# Patient Record
Sex: Male | Born: 1980 | Race: White | Hispanic: No | Marital: Married | Smoking: Current every day smoker
Health system: Southern US, Community
[De-identification: ages and names within clinical notes are randomized; demographics above are authoritative.]

## PROBLEM LIST (undated history)

## (undated) DIAGNOSIS — E109 Type 1 diabetes mellitus without complications: Secondary | ICD-10-CM

---
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
## 2009-01-25 ENCOUNTER — Inpatient Hospital Stay (HOSPITAL_COMMUNITY): Admission: EM | Admit: 2009-01-25 | Discharge: 2009-01-26 | Payer: Self-pay | Admitting: Emergency Medicine

## 2010-04-10 LAB — GLUCOSE, CAPILLARY
Glucose-Capillary: 146 mg/dL — ABNORMAL HIGH (ref 70–99)
Glucose-Capillary: 148 mg/dL — ABNORMAL HIGH (ref 70–99)
Glucose-Capillary: 186 mg/dL — ABNORMAL HIGH (ref 70–99)
Glucose-Capillary: 350 mg/dL — ABNORMAL HIGH (ref 70–99)

## 2010-04-10 LAB — COMPREHENSIVE METABOLIC PANEL
ALT: 35 U/L (ref 0–53)
AST: 26 U/L (ref 0–37)
Albumin: 5 g/dL (ref 3.5–5.2)
Alkaline Phosphatase: 113 U/L (ref 39–117)
BUN: 17 mg/dL (ref 6–23)
CO2: 6 mEq/L — CL (ref 19–32)
Calcium: 9 mg/dL (ref 8.4–10.5)
Chloride: 100 mEq/L (ref 96–112)
Creatinine, Ser: 1.6 mg/dL — ABNORMAL HIGH (ref 0.4–1.5)
GFR calc Af Amer: >60 mL/min (ref >60)
GFR calc non Af Amer: 52 mL/min — ABNORMAL LOW (ref >60)
Glucose, Bld: 353 mg/dL — ABNORMAL HIGH (ref 70–99)
Potassium: 4.4 mEq/L (ref 3.5–5.1)
Sodium: 133 mEq/L — ABNORMAL LOW (ref 135–145)
Total Bilirubin: 2.1 mg/dL — ABNORMAL HIGH (ref 0.3–1.2)
Total Protein: 8.6 g/dL — ABNORMAL HIGH (ref 6.0–8.3)

## 2010-04-10 LAB — CBC
HCT: 45.2 % (ref 39.0–52.0)
Hemoglobin: 15.7 g/dL (ref 13.0–17.0)
MCHC: 34.8 g/dL (ref 30.0–36.0)
MCHC: 35.6 g/dL (ref 30.0–36.0)
MCV: 101.4 fL — ABNORMAL HIGH (ref 78.0–100.0)
MCV: 99.5 fL (ref 78.0–100.0)
Platelets: 281 10*3/uL (ref 150–400)
RBC: 4.02 MIL/uL — ABNORMAL LOW (ref 4.22–5.81)
RBC: 4.46 MIL/uL (ref 4.22–5.81)
RDW: 12.3 % (ref 11.5–15.5)
WBC: 29.7 10*3/uL — ABNORMAL HIGH (ref 4.0–10.5)

## 2010-04-10 LAB — BASIC METABOLIC PANEL
BUN: 12 mg/dL (ref 6–23)
CO2: 22 mEq/L (ref 19–32)
CO2: 6 mEq/L — CL (ref 19–32)
Calcium: 8.1 mg/dL — ABNORMAL LOW (ref 8.4–10.5)
Calcium: 8.6 mg/dL (ref 8.4–10.5)
Chloride: 106 mEq/L (ref 96–112)
Chloride: 106 mEq/L (ref 96–112)
Chloride: 107 mEq/L (ref 96–112)
Creatinine, Ser: 0.97 mg/dL (ref 0.4–1.5)
Creatinine, Ser: 1.14 mg/dL (ref 0.4–1.5)
GFR calc Af Amer: >60 mL/min (ref >60)
GFR calc Af Amer: >60 mL/min (ref >60)
GFR calc Af Amer: >60 mL/min (ref >60)
Glucose, Bld: 217 mg/dL — ABNORMAL HIGH (ref 70–99)
Potassium: 3.8 mEq/L (ref 3.5–5.1)
Sodium: 131 mEq/L — ABNORMAL LOW (ref 135–145)
Sodium: 135 mEq/L (ref 135–145)

## 2010-04-10 LAB — MAGNESIUM
Magnesium: 2 mg/dL (ref 1.5–2.5)
Magnesium: 2 mg/dL (ref 1.5–2.5)
Magnesium: 2 mg/dL (ref 1.5–2.5)
Magnesium: 2.2 mg/dL (ref 1.5–2.5)

## 2010-04-10 LAB — URINE MICROSCOPIC-ADD ON

## 2010-04-10 LAB — DIFFERENTIAL
Basophils Absolute: 0 10*3/uL (ref 0.0–0.1)
Basophils Relative: 0 % (ref 0–1)
Eosinophils Absolute: 0 10*3/uL (ref 0.0–0.7)
Eosinophils Relative: 0 % (ref 0–5)
Lymphocytes Relative: 7 % — ABNORMAL LOW (ref 12–46)
Lymphs Abs: 2.1 10*3/uL (ref 0.7–4.0)
Monocytes Absolute: 2.1 10*3/uL — ABNORMAL HIGH (ref 0.1–1.0)
Monocytes Relative: 7 % (ref 3–12)
Neutro Abs: 25.5 10*3/uL — ABNORMAL HIGH (ref 1.7–7.7)
Neutrophils Relative %: 86 % — ABNORMAL HIGH (ref 43–77)

## 2010-04-10 LAB — PHOSPHORUS
Phosphorus: 2 mg/dL — ABNORMAL LOW (ref 2.3–4.6)
Phosphorus: 2.5 mg/dL (ref 2.3–4.6)
Phosphorus: 2.7 mg/dL (ref 2.3–4.6)

## 2010-04-10 LAB — URINALYSIS, ROUTINE W REFLEX MICROSCOPIC
Glucose, UA: >1000 mg/dL — AB
Specific Gravity, Urine: 1.027 (ref 1.005–1.030)
Urobilinogen, UA: 0.2 mg/dL (ref 0.0–1.0)
pH: 5 (ref 5.0–8.0)

## 2010-08-10 ENCOUNTER — Other Ambulatory Visit (HOSPITAL_COMMUNITY): Payer: Self-pay | Admitting: Family Medicine

## 2010-08-10 ENCOUNTER — Encounter (HOSPITAL_COMMUNITY): Payer: Self-pay

## 2010-08-10 ENCOUNTER — Ambulatory Visit (HOSPITAL_COMMUNITY)
Admission: RE | Admit: 2010-08-10 | Discharge: 2010-08-10 | Disposition: A | Discharge status: Home / Self Care | Payer: Managed Care, Other (non HMO) | Source: Ambulatory Visit | Attending: Family Medicine | Admitting: Family Medicine

## 2010-08-10 DIAGNOSIS — T148XXA Other injury of unspecified body region, initial encounter: Secondary | ICD-10-CM

## 2010-08-10 DIAGNOSIS — X58XXXA Exposure to other specified factors, initial encounter: Secondary | ICD-10-CM | POA: Insufficient documentation

## 2010-08-10 DIAGNOSIS — S91309A Unspecified open wound, unspecified foot, initial encounter: Secondary | ICD-10-CM | POA: Insufficient documentation

## 2010-08-10 DIAGNOSIS — E119 Type 2 diabetes mellitus without complications: Secondary | ICD-10-CM | POA: Insufficient documentation

## 2010-08-10 DIAGNOSIS — M7989 Other specified soft tissue disorders: Secondary | ICD-10-CM | POA: Insufficient documentation

## 2010-10-28 ENCOUNTER — Other Ambulatory Visit (HOSPITAL_COMMUNITY): Payer: Self-pay | Admitting: Internal Medicine

## 2010-10-28 DIAGNOSIS — R599 Enlarged lymph nodes, unspecified: Secondary | ICD-10-CM

## 2010-11-01 ENCOUNTER — Ambulatory Visit (HOSPITAL_COMMUNITY): Admission: RE | Admit: 2010-11-01 | Payer: Managed Care, Other (non HMO) | Source: Ambulatory Visit

## 2015-08-25 ENCOUNTER — Ambulatory Visit: Payer: Self-pay | Admitting: "Endocrinology

## 2018-07-29 ENCOUNTER — Encounter (HOSPITAL_COMMUNITY): Payer: Self-pay | Admitting: Emergency Medicine

## 2018-07-29 ENCOUNTER — Emergency Department (HOSPITAL_COMMUNITY): Payer: Managed Care, Other (non HMO)

## 2018-07-29 ENCOUNTER — Other Ambulatory Visit: Payer: Self-pay

## 2018-07-29 ENCOUNTER — Emergency Department (HOSPITAL_COMMUNITY)
Admission: EM | Admit: 2018-07-29 | Discharge: 2018-07-30 | Disposition: A | Discharge status: Home / Self Care | Payer: Managed Care, Other (non HMO) | Attending: Emergency Medicine | Admitting: Emergency Medicine

## 2018-07-29 DIAGNOSIS — R0789 Other chest pain: Secondary | ICD-10-CM | POA: Diagnosis present

## 2018-07-29 DIAGNOSIS — Z20828 Contact with and (suspected) exposure to other viral communicable diseases: Secondary | ICD-10-CM | POA: Insufficient documentation

## 2018-07-29 DIAGNOSIS — J40 Bronchitis, not specified as acute or chronic: Secondary | ICD-10-CM | POA: Insufficient documentation

## 2018-07-29 DIAGNOSIS — Z20822 Contact with and (suspected) exposure to covid-19: Secondary | ICD-10-CM

## 2018-07-29 DIAGNOSIS — E109 Type 1 diabetes mellitus without complications: Secondary | ICD-10-CM | POA: Insufficient documentation

## 2018-07-29 LAB — BASIC METABOLIC PANEL
Anion gap: 13 (ref 5–15)
BUN: 10 mg/dL (ref 6–20)
CO2: 24 mmol/L (ref 22–32)
Calcium: 9.5 mg/dL (ref 8.9–10.3)
Chloride: 102 mmol/L (ref 98–111)
Creatinine, Ser: 0.94 mg/dL (ref 0.61–1.24)
GFR calc Af Amer: >60 mL/min (ref >60)
GFR calc non Af Amer: >60 mL/min (ref >60)
Glucose, Bld: 227 mg/dL — ABNORMAL HIGH (ref 70–99)
Potassium: 4.1 mmol/L (ref 3.5–5.1)
Sodium: 139 mmol/L (ref 135–145)

## 2018-07-29 LAB — CBC
HCT: 43.8 % (ref 39.0–52.0)
Hemoglobin: 15.2 g/dL (ref 13.0–17.0)
MCH: 36.3 pg — ABNORMAL HIGH (ref 26.0–34.0)
MCHC: 34.7 g/dL (ref 30.0–36.0)
MCV: 104.5 fL — ABNORMAL HIGH (ref 80.0–100.0)
Platelets: 224 10*3/uL (ref 150–400)
RBC: 4.19 MIL/uL — ABNORMAL LOW (ref 4.22–5.81)
RDW: 12.2 % (ref 11.5–15.5)
WBC: 8.2 10*3/uL (ref 4.0–10.5)
nRBC: 0 % (ref 0.0–0.2)

## 2018-07-29 LAB — TROPONIN I (HIGH SENSITIVITY)
Troponin I (High Sensitivity): 5 ng/L (ref <18)
Troponin I (High Sensitivity): 4 ng/L (ref <18)

## 2018-07-29 LAB — D-DIMER, QUANTITATIVE: D-Dimer, Quant: 0.6 ug/mL-FEU — ABNORMAL HIGH (ref 0.00–0.50)

## 2018-07-29 MED ORDER — IOHEXOL 350 MG/ML SOLN
75.0000 mL | Freq: Once | INTRAVENOUS | Status: AC | PRN
  Administered 2018-07-29: 75 mL via INTRAVENOUS

## 2018-07-29 MED ORDER — SODIUM CHLORIDE 0.9% FLUSH: 3.0000 mL | Freq: Once | INTRAVENOUS | Status: DC

## 2018-07-29 NOTE — ED Notes (Signed)
No pain iv inserted for c-t angio

## 2018-07-29 NOTE — ED Notes (Signed)
Pt c/ochest pain for 2 days until he arrived here  No chest pain now.    Productive cough  No temp   Alert no distress

## 2018-07-29 NOTE — Discharge Instructions (Signed)
Person Under Monitoring Name: Tommy Martinez  Location: [address] [place] [address]   Infection Prevention Recommendations for Individuals Confirmed to have, or Being Evaluated for, 2019 Novel Coronavirus (COVID-19) Infection Who Receive Care at Home  Individuals who are confirmed to have, or are being evaluated for, COVID-19 should follow the prevention steps below until a healthcare provider or local or state health department says they can return to normal activities.  Stay home except to get medical care You should restrict activities outside your home, except for getting medical care. Do not go to work, school, or public areas, and do not use public transportation or taxis.  Call ahead before visiting your doctor Before your medical appointment, call the healthcare provider and tell them that you have, or are being evaluated for, COVID-19 infection. This will help the healthcare providers office take steps to keep other people from getting infected. Ask your healthcare provider to call the local or state health department.  Monitor your symptoms Seek prompt medical attention if your illness is worsening (e.g., difficulty breathing). Before going to your medical appointment, call the healthcare provider and tell them that you have, or are being evaluated for, COVID-19 infection. Ask your healthcare provider to call the local or state health department.  Wear a facemask You should wear a facemask that covers your nose and mouth when you are in the same room with other people and when you visit a healthcare provider. People who live with or visit you should also wear a facemask while they are in the same room with you.  Separate yourself from other people in your home As much as possible, you should stay in a different room from other people in your home. Also, you should use a separate bathroom, if available.  Avoid sharing household items You should not share  dishes, drinking glasses, cups, eating utensils, towels, bedding, or other items with other people in your home. After using these items, you should wash them thoroughly with soap and water.  Cover your coughs and sneezes Cover your mouth and nose with a tissue when you cough or sneeze, or you can cough or sneeze into your sleeve. Throw used tissues in a lined trash can, and immediately wash your hands with soap and water for at least 20 seconds or use an alcohol-based hand rub.  Wash your [employer]hands Wash your hands often and thoroughly with soap and water for at least 20 seconds. You can use an alcohol-based hand sanitizer if soap and water are not available and if your hands are not visibly dirty. Avoid touching your eyes, nose, and mouth with unwashed hands.   Prevention Steps for Caregivers and Household Members of Individuals Confirmed to have, or Being Evaluated for, COVID-19 Infection Being Cared for in the Home  If you live with, or provide care at home for, a person confirmed to have, or being evaluated for, COVID-19 infection please follow these guidelines to prevent infection:  Follow healthcare providers instructions Make sure that you understand and can help the patient follow any healthcare provider instructions for all care.  Provide for the patients basic needs You should help the patient with basic needs in the home and provide support for getting groceries, prescriptions, and other personal needs.  Monitor the patients symptoms If they are getting sicker, call his or her medical provider and tell them that the patient has, or is being evaluated for, COVID-19 infection. This will help the healthcare providers office  take steps to keep other people from getting infected. °Ask the healthcare provider to call the local or state health department. ° °Limit the number of people who have contact with the patient °If possible, have only one caregiver for the patient. °Other  household members should stay in another home or place of residence. If this is not possible, they should stay °in another room, or be separated from the patient as much as possible. Use a separate bathroom, if available. °Restrict visitors who do not have an essential need to be in the home. ° °Keep older adults, very young children, and other sick people away from the patient °Keep older adults, very young children, and those who have compromised immune systems or chronic health conditions away from the patient. This includes people with chronic heart, lung, or kidney conditions, diabetes, and cancer. ° °Ensure good ventilation °Make sure that shared spaces in the home have good air flow, such as from an air conditioner or an opened window, °weather permitting. ° °Wash your hands often °Wash your hands often and thoroughly with soap and water for at least 20 seconds. You can use an alcohol based hand sanitizer if soap and water are not available and if your hands are not visibly dirty. °Avoid touching your eyes, nose, and mouth with unwashed hands. °Use disposable paper towels to dry your hands. If not available, use dedicated cloth towels and replace them when they become wet. ° °Wear a facemask and gloves °Wear a disposable facemask at all times in the room and gloves when you touch or have contact with the patient’s blood, body fluids, and/or secretions or excretions, such as sweat, saliva, sputum, nasal mucus, vomit, urine, or feces.  Ensure the mask fits over your nose and mouth tightly, and do not touch it during use. °Throw out disposable facemasks and gloves after using them. Do not reuse. °Wash your hands immediately after removing your facemask and gloves. °If your personal clothing becomes contaminated, carefully remove clothing and launder. Wash your hands after handling contaminated clothing. °Place all used disposable facemasks, gloves, and other waste in a lined container before disposing them with  other household waste. °Remove gloves and wash your hands immediately after handling these items. ° °Do not share dishes, glasses, or other household items with the patient °Avoid sharing household items. You should not share dishes, drinking glasses, cups, eating utensils, towels, bedding, or other items with a patient who is confirmed to have, or being evaluated for, COVID-19 infection. °After the person uses these items, you should wash them thoroughly with soap and water. ° °Wash laundry thoroughly °Immediately remove and wash clothes or bedding that have blood, body fluids, and/or secretions or excretions, such as sweat, saliva, sputum, nasal mucus, vomit, urine, or feces, on them. °Wear gloves when handling laundry from the patient. °Read and follow directions on labels of laundry or clothing items and detergent. In general, wash and dry with the warmest temperatures recommended on the label. ° °Clean all areas the individual has used often °Clean all touchable surfaces, such as counters, tabletops, doorknobs, bathroom fixtures, toilets, phones, keyboards, tablets, and bedside tables, every day. Also, clean any surfaces that may have blood, body fluids, and/or secretions or excretions on them. °Wear gloves when cleaning surfaces the patient has come in contact with. °Use a diluted bleach solution (e.g., dilute bleach with 1 part bleach and 10 parts water) or a household disinfectant with a label that says EPA-registered for coronaviruses. To make a bleach   solution at home, add 1 tablespoon of bleach to 1 quart (4 cups) of water. For a larger supply, add  cup of bleach to 1 gallon (16 cups) of water. Read labels of cleaning products and follow recommendations provided on product labels. Labels contain instructions for safe and effective use of the cleaning product including precautions you should take when applying the product, such as wearing gloves or eye protection and making sure you have good ventilation  during use of the product. Remove gloves and wash hands immediately after cleaning.  Monitor yourself for signs and symptoms of illness Caregivers and household members are considered close contacts, should monitor their health, and will be asked to limit movement outside of the home to the extent possible. Follow the monitoring steps for close contacts listed on the symptom monitoring form.   ? If you have additional questions, contact your local health department or call the epidemiologist on call at [phone] (available 24/7). ? This guidance is subject to change. For the most up-to-date guidance from [facility], please refer to their website: TripMetro.huhttps://www.cdc.gov/coronavirus/2019-ncov/hcp/guidance-prevent-spread.html      Person Under Monitoring Name: Tommy Martinez  Location: [address] [place] [id number]   CORONAVIRUS DISEASE 2019 (COVID-19) Guidance for Persons Under Investigation You are being tested for the virus that causes coronavirus disease 2019 (COVID-19). Public health actions are necessary to ensure protection of your health and the health of others, and to prevent further spread of infection. COVID-19 is caused by a virus that can cause symptoms, such as fever, cough, and shortness of breath. The primary transmission from person to person is by coughing or sneezing. On February 22, 2018, the World Health Organization announced a [employer] Emergency of International Concern and on February 23, 2018 the U.S. Department of Health and Human Services declared a public health emergency. If the virus that causesCOVID-19 spreads in the community, it could have severe public health consequences.  As a person under investigation for COVID-19, the [place] Department of Health and [employer], Division of [employer] advises you to adhere to the following guidance until your test results are reported to you. If your test result is positive, you will receive  additional information from your provider and your local health department at that time.  Remain at home until you are cleared by your health provider or public health authorities.  Keep a log of visitors to your home using the form provided. Any visitors to your home must be aware of your isolation status. If you plan to move to a new address or leave the county, notify the local health department in your county. Call a doctor or seek care if you have an urgent medical need. Before seeking medical care, call ahead and get instructions from the provider before arriving at the medical office, clinic or hospital. Notify them that you are being tested for the virus that causes COVID-19 so arrangements can be made, as necessary, to prevent transmission to others in the healthcare setting. Next, notify the local health department in your county. If a medical emergency arises and you need to call 911, inform the first responders that you are being tested for the virus that causes COVID-19. Next, notify the local health department in your county. Adhere to all guidance set forth by the [facility]  Division of [employer] for [facility] of patients that is based on guidance from the Center for Disease Control and Prevention with suspected or confirmed COVID-19. It is provided  with this guidance for Persons Under Investigation.  Your health and the health of our community are our top priorities. Public Health officials remain available to provide assistance and counseling to you about COVID-19 and compliance with this guidance.  Provider: ____________________________________________________________ Date: ______/_____/_________  By signing below, you acknowledge that you have read and agree to comply with this Guidance for Persons Under Investigation. ______________________________________________________________ Date: ______/_____/_________  WHO DO I CALL? You can find a list of local health departments  here: [email or web address] Health Department: ____________________________________________________________________ Contact Name: ________________________________________________________________________ Telephone: ___________________________________________________________________________  Marice Potter, [place], Communicable Disease Branch COVID-19 Guidance for Persons Under Investigation March 31, 2018

## 2018-07-29 NOTE — ED Triage Notes (Signed)
Pt here for new onset centralized chest pain radiating to the shoulder blades. Pt also reports some dizziness. Denies SOB.

## 2018-07-30 NOTE — ED Provider Notes (Signed)
I assumed care of this patient from Dr. Billy Fischer at 2300.  Please see their note for further details of Hx, PE.  Briefly patient is a 38 y.o. male who presented with pleuritic chest pain. EKG and other work up reassuring. However dimer mildly elevated. Pending CTA.  CTA negative for PE or Pneumonia. Noted to have peribronchial thickening. Will get OP COVID testing.  Otherwise he is well-appearing, nontoxic and not hypoxic.  Appropriate for discharge with strict return precautions.  Isolation recommended.  Tommy Martinez was evaluated in Emergency Department on 07/30/2018 for the symptoms described in the history of present illness. He was evaluated in the context of the global COVID-19 pandemic, which necessitated consideration that the patient might be at risk for infection with the SARS-CoV-2 virus that causes COVID-19. Institutional protocols and algorithms that pertain to the evaluation of patients at risk for COVID-19 are in a state of rapid change based on information released by regulatory bodies including the CDC and federal and state organizations. These policies and algorithms were followed during the patient's care in the ED.  Patient evaluated, tested and sent home with instructions for home care and Quarantine. Instructed to seek further care if symptoms worsen.  Is set up with follow up for a virtual visit with PCP in next 24-48 hours.   The patient appears reasonably screened and/or stabilized for discharge and I doubt any other medical condition or other [facility] requiring further screening, evaluation, or treatment in the ED at this time prior to discharge.  Disposition: Discharge  Condition: Good  I have discussed the results, Dx and Tx plan with the patient who expressed understanding and agree(s) with the plan. Discharge instructions discussed at great length. The patient was given strict return precautions who verbalized understanding of the instructions. No further questions at time of  discharge.    ED Discharge Orders    None        Follow Up: Sharilyn Sites, [place] [address] [phone]  Schedule an appointment as soon as possible for a visit  If symptoms do not improve or  worsen, in 3-5 days      Cardama, Grayce Sessions, MD 07/30/18 0045

## 2018-07-30 NOTE — ED Provider Notes (Signed)
[facility] EMERGENCY DEPARTMENT Provider Note   CSN: [id number] Arrival date & time: 07/29/18  1748     History   Chief Complaint Chief Complaint  Patient presents with   Chest Pain    HPI Tommy Martinez is a 38 y.o. male.     HPI   38yo male with history of Type 1 DM presents with concern for chest pain. Reports pain started yesterday. Sharp pain worse with deep breaths and coughing. Mild nonproductive cough. Not exertional pain, not positional, not worse with palpation. Associated dyspnea.  No n/v/fever.  No recent surgeries, long trips or leg swelling or pain. No hx of DVT or PE.  No fam hx of early CAD. Smokes cigarettes and drinks etoh regularly, denies other drug use.   Past Medical History:  Diagnosis Date   Diabetes mellitus     There are no active problems to display for this patient.      Home Medications    Prior to Admission medications   Not on File    Family History No family history on file.  Social History Social History   Tobacco Use   Smoking status: Not on file  Substance Use Topics   Alcohol use: Not on file   Drug use: Not on file     Allergies   Patient has no known allergies.   Review of Systems Review of Systems  Constitutional: Negative for fever.  HENT: Negative for sore throat.   Eyes: Negative for visual disturbance.  Respiratory: Positive for cough and shortness of breath.   Cardiovascular: Positive for chest pain.  Gastrointestinal: Negative for abdominal pain, nausea and vomiting.  Genitourinary: Negative for difficulty urinating.  Musculoskeletal: Negative for back pain and neck stiffness.  Skin: Negative for rash.  Neurological: Negative for syncope and headaches.     Physical Exam Updated Vital Signs BP (!) 144/94    Pulse (!) 106    Temp 98.2 F (36.8 C) (Oral)    Resp 15    SpO2 98%   Physical Exam Vitals signs and nursing note reviewed.  Constitutional:      General: He is not in  acute distress.    Appearance: He is well-developed. He is not diaphoretic.  HENT:     Head: Normocephalic and atraumatic.  Eyes:     Conjunctiva/sclera: Conjunctivae normal.  Neck:     Musculoskeletal: Normal range of motion.  Cardiovascular:     Rate and Rhythm: Regular rhythm. Tachycardia present.     Heart sounds: Normal heart sounds. No murmur. No friction rub. No gallop.   Pulmonary:     Effort: Pulmonary effort is normal. No respiratory distress.     Breath sounds: No rales.  Abdominal:     General: There is no distension.     Palpations: Abdomen is soft.     Tenderness: There is no abdominal tenderness. There is no guarding.  Skin:    General: Skin is warm and dry.  Neurological:     Mental Status: He is alert and oriented to person, place, and time.      ED Treatments / Results  Labs (all labs ordered are listed, but only abnormal results are displayed) Labs Reviewed  BASIC METABOLIC PANEL - Abnormal; Notable for the following components:      Result Value   Glucose, Bld 227 (*)    All other components within normal limits  CBC - Abnormal; Notable for the following components:   RBC 4.19 (*)  MCV 104.5 (*)    MCH 36.3 (*)    All other components within normal limits  D-DIMER, QUANTITATIVE (NOT AT [facility]) - Abnormal; Notable for the following components:   D-Dimer, Quant 0.60 (*)    All other components within normal limits  NOVEL CORONAVIRUS, NAA (HOSPITAL ORDER, SEND-OUT TO REF LAB)  TROPONIN I (HIGH SENSITIVITY)  TROPONIN I (HIGH SENSITIVITY)    EKG EKG Interpretation  Date/Time:  Sunday July 29 2018 18:03:56 EDT Ventricular Rate:  113 PR Interval:  150 QRS Duration: 90 QT Interval:  322 QTC Calculation: 441 R Axis:   108 Text Interpretation:  Sinus tachycardia Right atrial enlargement Rightward axis Pulmonary disease pattern Abnormal ECG No previous ECGs available Confirmed by Spiro Ausborn ([id number]) on 07/29/2018 6:07:36 PM   Radiology Dg Chest  2 View  Result Date: 07/29/2018 CLINICAL DATA:  38 year old male with new onset chest pain radiating to the shoulder blades with dizziness. EXAM: CHEST - 2 VIEW COMPARISON:  None. FINDINGS: Lung volumes and mediastinal contours are normal. Visualized tracheal air column is within normal limits. No pneumothorax, pulmonary edema, pleural effusion or confluent pulmonary opacity. Mild artifact from EKG leads. Questionable borderline to mild increased interstitial markings. No osseous abnormality identified. Paucity of bowel gas in the upper abdomen. IMPRESSION: Negative.  No acute cardiopulmonary abnormality. Electronically Signed   By: H  Hall M.D.   On: 07/29/2018 18:43   Ct Angio Chest Pe W And/or Wo Contrast  Result Date: 07/29/2018 CLINICAL DATA:  Chest pain, PE suspected, low/intermediate prob, positive D-dimer EXAM: CT ANGIOGRAPHY CHEST WITH CONTRAST TECHNIQUE: Multidetector CT imaging of the chest was performed using the standard protocol during bolus administration of intravenous contrast. Multiplanar CT image reconstructions and MIPs were obtained to evaluate the vascular anatomy. CONTRAST:  75mL OMNIPAQUE IOHEXOL 350 MG/ML SOLN COMPARISON:  Radiographs earlier this day. FINDINGS: Cardiovascular: There are no filling defects within the pulmonary arteries to suggest pulmonary embolus. Thoracic aorta is normal in caliber without dissection. Conventional branching pattern from the aortic arch. Heart is normal in size. No pericardial fluid. Mediastinum/Nodes: No enlarged mediastinal or hilar lymph nodes. Esophagus is decompressed. Visualized thyroid nodule. Lungs/Pleura: Mucus/debris in the left lower lobe bronchus. Central bronchial thickening is most prominent in both lower lobes. No consolidation or focal airspace disease. No pulmonary edema. No pleural fluid. Small biapical blebs/emphysema. Upper Abdomen: No acute abnormality. Musculoskeletal: There are no acute or suspicious osseous abnormalities. Review  of the MIP images confirms the above findings. IMPRESSION: 1. No pulmonary embolus. 2. Central bronchial thickening, bronchitis, asthma, or smoking related lung disease if patient is a smoker. Mucus/debris in the left lower lobe bronchus. 3. Small biapical blebs versus mild emphysema, recommend correlation with smoking history. Electronically Signed   By: Melanie  Sanford M.D.   On: 07/29/2018 23:28    Procedures Procedures (including critical care time)  Medications Ordered in ED Medications  iohexol (OMNIPAQUE) 350 MG/ML injection 75 mL (75 mLs Intravenous Contrast Given 07/29/18 2236)     Initial Impression / Assessment and Plan / ED Course  I have reviewed the triage vital signs and the nursing notes.  Pertinent labs & imaging results that were available during my care of the patient were reviewed by me and considered in my medical decision making (see chart for details).        38 yo male with history of Type 1 DM presents with concern for chest pain.  Differential diagnosis for chest pain includes pulmonary embolus, dissection, pneumothorax, pneumonia, ACS, myocarditis,  pericarditis.  EKG was done and evaluate by me and showed no acute ST changes and no signs of pericarditis. Chest x-ray was done and evaluated by me and radiology and showed no sign of pneumonia or pneumothorax. Patient is low risk Wells and ddimer positive with CTA pending.   Patient is low risk HEART score and had delta troponins which were 5 and 4 and doubt ACS.   CT PE study pending at time of transfer of care. If no PE, plan to send home with COVID test and precautions.   Final Clinical Impressions(s) / ED Diagnoses   Final diagnoses:  Bronchitis  Suspected Covid-19 Virus Infection    ED Discharge Orders    None       Alvira MondaySchlossman, Madgie Dhaliwal, MD 07/30/18 1446

## 2018-07-31 LAB — NOVEL CORONAVIRUS, NAA (HOSP ORDER, SEND-OUT TO REF LAB; TAT 18-24 HRS): SARS-CoV-2, NAA: NOT DETECTED

## 2019-03-09 ENCOUNTER — Emergency Department (HOSPITAL_COMMUNITY): Payer: Managed Care, Other (non HMO)

## 2019-03-09 ENCOUNTER — Other Ambulatory Visit: Payer: Self-pay

## 2019-03-09 ENCOUNTER — Encounter (HOSPITAL_COMMUNITY): Payer: Self-pay | Admitting: *Deleted

## 2019-03-09 ENCOUNTER — Inpatient Hospital Stay (HOSPITAL_COMMUNITY)
Admission: EM | Admit: 2019-03-09 | Discharge: 2019-03-11 | DRG: 638 | Disposition: A | Discharge status: Home / Self Care | Payer: Managed Care, Other (non HMO) | Attending: Internal Medicine | Admitting: Internal Medicine

## 2019-03-09 DIAGNOSIS — Z20822 Contact with and (suspected) exposure to covid-19: Secondary | ICD-10-CM | POA: Diagnosis present

## 2019-03-09 DIAGNOSIS — E1010 Type 1 diabetes mellitus with ketoacidosis without coma: Principal | ICD-10-CM | POA: Diagnosis present

## 2019-03-09 DIAGNOSIS — Z9119 Patient's noncompliance with other medical treatment and regimen: Secondary | ICD-10-CM

## 2019-03-09 DIAGNOSIS — F17200 Nicotine dependence, unspecified, uncomplicated: Secondary | ICD-10-CM | POA: Diagnosis present

## 2019-03-09 DIAGNOSIS — F1020 Alcohol dependence, uncomplicated: Secondary | ICD-10-CM | POA: Diagnosis not present

## 2019-03-09 DIAGNOSIS — J029 Acute pharyngitis, unspecified: Secondary | ICD-10-CM | POA: Diagnosis present

## 2019-03-09 DIAGNOSIS — F1010 Alcohol abuse, uncomplicated: Secondary | ICD-10-CM | POA: Diagnosis present

## 2019-03-09 DIAGNOSIS — E861 Hypovolemia: Secondary | ICD-10-CM | POA: Diagnosis present

## 2019-03-09 DIAGNOSIS — Z794 Long term (current) use of insulin: Secondary | ICD-10-CM

## 2019-03-09 DIAGNOSIS — R7401 Elevation of levels of liver transaminase levels: Secondary | ICD-10-CM

## 2019-03-09 DIAGNOSIS — R000 Tachycardia, unspecified: Secondary | ICD-10-CM | POA: Diagnosis present

## 2019-03-09 DIAGNOSIS — N179 Acute kidney failure, unspecified: Secondary | ICD-10-CM

## 2019-03-09 DIAGNOSIS — E1110 Type 2 diabetes mellitus with ketoacidosis without coma: Secondary | ICD-10-CM | POA: Diagnosis present

## 2019-03-09 DIAGNOSIS — R1110 Vomiting, unspecified: Secondary | ICD-10-CM | POA: Diagnosis not present

## 2019-03-09 HISTORY — DX: Type 1 diabetes mellitus without complications: E10.9

## 2019-03-09 LAB — URINALYSIS, ROUTINE W REFLEX MICROSCOPIC
Bacteria, UA: NONE SEEN
Bilirubin Urine: NEGATIVE
Glucose, UA: >=500 mg/dL — AB
Ketones, ur: 80 mg/dL — AB
Leukocytes,Ua: NEGATIVE
Nitrite: NEGATIVE
Protein, ur: 100 mg/dL — AB
Specific Gravity, Urine: 1.014 (ref 1.005–1.030)
pH: 5 (ref 5.0–8.0)

## 2019-03-09 LAB — COMPREHENSIVE METABOLIC PANEL
ALT: 68 U/L — ABNORMAL HIGH (ref 0–44)
AST: 179 U/L — ABNORMAL HIGH (ref 15–41)
Albumin: 4.5 g/dL (ref 3.5–5.0)
Alkaline Phosphatase: 153 U/L — ABNORMAL HIGH (ref 38–126)
Anion gap: 30 — ABNORMAL HIGH (ref 5–15)
BUN: 17 mg/dL (ref 6–20)
CO2: 16 mmol/L — ABNORMAL LOW (ref 22–32)
Calcium: 8.8 mg/dL — ABNORMAL LOW (ref 8.9–10.3)
Chloride: 89 mmol/L — ABNORMAL LOW (ref 98–111)
Creatinine, Ser: 1.55 mg/dL — ABNORMAL HIGH (ref 0.61–1.24)
GFR calc Af Amer: >60 mL/min (ref >60)
GFR calc non Af Amer: 56 mL/min — ABNORMAL LOW (ref >60)
Glucose, Bld: 219 mg/dL — ABNORMAL HIGH (ref 70–99)
Potassium: 4 mmol/L (ref 3.5–5.1)
Sodium: 135 mmol/L (ref 135–145)
Total Bilirubin: 2 mg/dL — ABNORMAL HIGH (ref 0.3–1.2)
Total Protein: 8 g/dL (ref 6.5–8.1)

## 2019-03-09 LAB — BASIC METABOLIC PANEL
Anion gap: 13 (ref 5–15)
BUN: 14 mg/dL (ref 6–20)
CO2: 27 mmol/L (ref 22–32)
Calcium: 8.4 mg/dL — ABNORMAL LOW (ref 8.9–10.3)
Chloride: 98 mmol/L (ref 98–111)
Creatinine, Ser: 0.99 mg/dL (ref 0.61–1.24)
GFR calc Af Amer: >60 mL/min (ref >60)
GFR calc non Af Amer: >60 mL/min (ref >60)
Glucose, Bld: 85 mg/dL (ref 70–99)
Potassium: 4.1 mmol/L (ref 3.5–5.1)
Sodium: 138 mmol/L (ref 135–145)

## 2019-03-09 LAB — CBG MONITORING, ED
Glucose-Capillary: 252 mg/dL — ABNORMAL HIGH (ref 70–99)
Glucose-Capillary: 126 mg/dL — ABNORMAL HIGH (ref 70–99)
Glucose-Capillary: 101 mg/dL — ABNORMAL HIGH (ref 70–99)
Glucose-Capillary: 87 mg/dL (ref 70–99)

## 2019-03-09 LAB — CBC WITH DIFFERENTIAL/PLATELET
Abs Immature Granulocytes: 0.09 10*3/uL — ABNORMAL HIGH (ref 0.00–0.07)
Basophils Absolute: 0 10*3/uL (ref 0.0–0.1)
Basophils Relative: 0 %
Eosinophils Absolute: 0 10*3/uL (ref 0.0–0.5)
Eosinophils Relative: 0 %
HCT: 41.8 % (ref 39.0–52.0)
Hemoglobin: 14.4 g/dL (ref 13.0–17.0)
Immature Granulocytes: 1 %
Lymphocytes Relative: 4 %
Lymphs Abs: 0.4 10*3/uL — ABNORMAL LOW (ref 0.7–4.0)
MCH: 36.8 pg — ABNORMAL HIGH (ref 26.0–34.0)
MCHC: 34.4 g/dL (ref 30.0–36.0)
MCV: 106.9 fL — ABNORMAL HIGH (ref 80.0–100.0)
Monocytes Absolute: 0.5 10*3/uL (ref 0.1–1.0)
Monocytes Relative: 4 %
Neutro Abs: 11.1 10*3/uL — ABNORMAL HIGH (ref 1.7–7.7)
Neutrophils Relative %: 91 %
Platelets: 218 10*3/uL (ref 150–400)
RBC: 3.91 MIL/uL — ABNORMAL LOW (ref 4.22–5.81)
RDW: 12 % (ref 11.5–15.5)
WBC: 12.2 10*3/uL — ABNORMAL HIGH (ref 4.0–10.5)
nRBC: 0 % (ref 0.0–0.2)

## 2019-03-09 LAB — GLUCOSE, CAPILLARY
Glucose-Capillary: 77 mg/dL (ref 70–99)
Glucose-Capillary: 76 mg/dL (ref 70–99)

## 2019-03-09 LAB — RESPIRATORY PANEL BY RT PCR (FLU A&B, COVID)
Influenza A by PCR: NEGATIVE
Influenza B by PCR: NEGATIVE
SARS Coronavirus 2 by RT PCR: NEGATIVE

## 2019-03-09 LAB — ETHANOL: Alcohol, Ethyl (B): <10 mg/dL (ref <10)

## 2019-03-09 LAB — BLOOD GAS, VENOUS
Acid-base deficit: 5.2 mmol/L — ABNORMAL HIGH (ref 0.0–2.0)
Bicarbonate: 18.6 mmol/L — ABNORMAL LOW (ref 20.0–28.0)
FIO2: 21
O2 Saturation: 27.3 %
Patient temperature: 36.6
pCO2, Ven: 40.1 mmHg — ABNORMAL LOW (ref 44.0–60.0)
pH, Ven: 7.316 (ref 7.250–7.430)
pO2, Ven: <31 mmHg — CL (ref 32.0–45.0)

## 2019-03-09 LAB — LIPASE, BLOOD: Lipase: 31 U/L (ref 11–51)

## 2019-03-09 MED ORDER — ONDANSETRON HCL 4 MG/2ML IJ SOLN
4.0000 mg | Freq: Once | INTRAMUSCULAR | Status: AC
  Administered 2019-03-09: 17:00:00 4 mg via INTRAVENOUS
  Filled 2019-03-09: qty 2

## 2019-03-09 MED ORDER — INSULIN REGULAR(HUMAN) IN NACL 100-0.9 UT/100ML-% IV SOLN: INTRAVENOUS | Status: DC

## 2019-03-09 MED ORDER — INSULIN ASPART 100 UNIT/ML ~~LOC~~ SOLN: 0.0000 [IU] | Freq: Three times a day (TID) | SUBCUTANEOUS | Status: DC

## 2019-03-09 MED ORDER — PHENOL 1.4 % MT LIQD
1.0000 | OROMUCOSAL | Status: DC | PRN
  Filled 2019-03-09 (×2): qty 177

## 2019-03-09 MED ORDER — LORAZEPAM 2 MG/ML IJ SOLN: 1.0000 mg | INTRAMUSCULAR | Status: DC | PRN

## 2019-03-09 MED ORDER — DEXTROSE-NACL 5-0.45 % IV SOLN: INTRAVENOUS | Status: DC

## 2019-03-09 MED ORDER — FOLIC ACID 1 MG PO TABS
1.0000 mg | ORAL_TABLET | Freq: Every day | ORAL | Status: DC
  Administered 2019-03-10 – 2019-03-11 (×2): 1 mg via ORAL
  Filled 2019-03-09 (×2): qty 1

## 2019-03-09 MED ORDER — FAMOTIDINE IN NACL 20-0.9 MG/50ML-% IV SOLN
20.0000 mg | INTRAVENOUS | Status: DC
  Administered 2019-03-09 – 2019-03-10 (×2): 20 mg via INTRAVENOUS
  Filled 2019-03-09 (×2): qty 50

## 2019-03-09 MED ORDER — POTASSIUM CHLORIDE 10 MEQ/100ML IV SOLN
10.0000 meq | INTRAVENOUS | Status: AC
  Administered 2019-03-09 (×2): 10 meq via INTRAVENOUS
  Filled 2019-03-09 (×2): qty 100

## 2019-03-09 MED ORDER — INSULIN REGULAR(HUMAN) IN NACL 100-0.9 UT/100ML-% IV SOLN
INTRAVENOUS | Status: DC
  Administered 2019-03-09: 20:00:00 0.6 [IU]/h via INTRAVENOUS
  Filled 2019-03-09: qty 100

## 2019-03-09 MED ORDER — ONDANSETRON HCL 4 MG/2ML IJ SOLN
4.0000 mg | Freq: Four times a day (QID) | INTRAMUSCULAR | Status: DC | PRN
  Administered 2019-03-09: 21:00:00 4 mg via INTRAVENOUS
  Filled 2019-03-09: qty 2

## 2019-03-09 MED ORDER — PHENOL 1.4 % MT LIQD
1.0000 | OROMUCOSAL | Status: DC | PRN
  Administered 2019-03-09: 23:00:00 1 via OROMUCOSAL

## 2019-03-09 MED ORDER — HEPARIN SODIUM (PORCINE) 5000 UNIT/ML IJ SOLN
5000.0000 [IU] | Freq: Three times a day (TID) | INTRAMUSCULAR | Status: DC
  Administered 2019-03-09 – 2019-03-11 (×5): 5000 [IU] via SUBCUTANEOUS
  Filled 2019-03-09 (×5): qty 1

## 2019-03-09 MED ORDER — POTASSIUM CHLORIDE 10 MEQ/100ML IV SOLN
10.0000 meq | INTRAVENOUS | Status: AC
  Administered 2019-03-09 – 2019-03-10 (×2): 10 meq via INTRAVENOUS
  Filled 2019-03-09 (×2): qty 100

## 2019-03-09 MED ORDER — LORAZEPAM 1 MG PO TABS: 1.0000 mg | ORAL_TABLET | ORAL | Status: DC | PRN

## 2019-03-09 MED ORDER — NICOTINE 7 MG/24HR TD PT24
7.0000 mg | MEDICATED_PATCH | Freq: Every day | TRANSDERMAL | Status: DC
  Administered 2019-03-09 – 2019-03-10 (×2): 7 mg via TRANSDERMAL
  Filled 2019-03-09 (×6): qty 1

## 2019-03-09 MED ORDER — SODIUM CHLORIDE 0.9 % IV SOLN
INTRAVENOUS | Status: DC
  Administered 2019-03-09: 20:00:00 100 mL/h via INTRAVENOUS

## 2019-03-09 MED ORDER — LORAZEPAM 2 MG/ML IJ SOLN: 0.0000 mg | Freq: Three times a day (TID) | INTRAMUSCULAR | Status: DC

## 2019-03-09 MED ORDER — ADULT MULTIVITAMIN W/MINERALS CH
1.0000 | ORAL_TABLET | Freq: Every day | ORAL | Status: DC
  Administered 2019-03-10 – 2019-03-11 (×2): 1 via ORAL
  Filled 2019-03-09 (×2): qty 1

## 2019-03-09 MED ORDER — DEXTROSE 50 % IV SOLN: 0.0000 mL | INTRAVENOUS | Status: DC | PRN

## 2019-03-09 MED ORDER — INSULIN GLARGINE 100 UNIT/ML ~~LOC~~ SOLN
10.0000 [IU] | Freq: Every day | SUBCUTANEOUS | Status: DC
  Administered 2019-03-09: 10 [IU] via SUBCUTANEOUS
  Filled 2019-03-09 (×2): qty 0.1

## 2019-03-09 MED ORDER — LORAZEPAM 2 MG/ML IJ SOLN
0.0000 mg | INTRAMUSCULAR | Status: DC
  Administered 2019-03-09: 1 mg via INTRAVENOUS
  Filled 2019-03-09 (×2): qty 1

## 2019-03-09 MED ORDER — PROCHLORPERAZINE EDISYLATE 10 MG/2ML IJ SOLN
10.0000 mg | Freq: Four times a day (QID) | INTRAMUSCULAR | Status: DC | PRN
  Administered 2019-03-09: 10 mg via INTRAVENOUS
  Filled 2019-03-09: qty 2

## 2019-03-09 MED ORDER — SODIUM CHLORIDE 0.9 % IV SOLN: INTRAVENOUS | Status: DC

## 2019-03-09 MED ORDER — THIAMINE HCL 100 MG/ML IJ SOLN: 100.0000 mg | Freq: Every day | INTRAMUSCULAR | Status: DC

## 2019-03-09 MED ORDER — DEXTROSE-NACL 5-0.45 % IV SOLN
INTRAVENOUS | Status: DC
  Administered 2019-03-09 (×2): 75 mL/h via INTRAVENOUS

## 2019-03-09 MED ORDER — SODIUM CHLORIDE 0.9 % IV BOLUS
2000.0000 mL | Freq: Once | INTRAVENOUS | Status: AC
  Administered 2019-03-09: 17:00:00 2000 mL via INTRAVENOUS

## 2019-03-09 MED ORDER — THIAMINE HCL 100 MG PO TABS
100.0000 mg | ORAL_TABLET | Freq: Every day | ORAL | Status: DC
  Administered 2019-03-10 – 2019-03-11 (×2): 100 mg via ORAL
  Filled 2019-03-09 (×2): qty 1

## 2019-03-09 NOTE — H&P (Signed)
TRH H&P    Patient Demographics:    Tommy Martinez, is a 39 y.o. male  MRN: [id number]  DOB - 1980-10-18  Admit Date - 03/09/2019  Referring MD/NP/PA: Merleen Nicely  Outpatient Primary MD for the patient is Sharilyn Sites, MD  Patient coming from: Home  Chief complaint- Vomiting   HPI:    Tommy Martinez  is a 39 y.o. male, with a history of type one diabetes presents with nausea, vomiting, diarrhea, and abdominal pain that started at 3:00 am 03/09/19. Patient reports that he knew he was in DKA because he has had DKA before. He does not check his glucose at home, and reports that he "wings it." In an effort to get himself out of DKA, patient took 95units of short acting insulin throughout the day, but his nausea and vomiting continued, so he decided to come into the ED. Patient reports that his emesis was yellow at first, and then turned clear after he drank a bottle of water. Patient reports that there was no undigested food or blood present. He is not sure when his last normal meal was, but reports that it was sometime on the 12th of Feb. His diarrhea has not been bloody per his report, but he can offer no further details into color, consistency, etc. Patient notes associated epigastric pain that started after he had vomited several times. Patient reports that it feels like heart burn, and it radiates into his throat. Patient reports a lot of pain in his throat - again there was not hematemesis. His last symptom to develop is a productive cough with clear sputum.   Patient's last DKA episode was after a night of heavy drinking. Patient does report that he drinks 0.5 - 1 fifth of EtOH every day. Last night, he reportedly drank "a lot more" than that. He can't quantify how much. Patient has never had a seizure 2/2 EtOH withdrawal, but has developed tremors 2/2 to EtOH withdrawal. His EtOH level at the time of admission was less than 10.  As for patient's DMI management - patient is unclear how much insulin he actually takes. He reports that he usually takes 15u long acting insulin in the AM and PM each day. He thinks he has a carb correction of 2:1. He is not sure about a post-prandial sliding scale correction. Again, patient is non compliant with his fingerstick glucose checks.   In the ED - T 97.9, P 113-145, R24-31 BP 148/96 - 174/108 O2 sat 95-99% on Room air PH 7.3, anion gap 30, Bicarb 16, glucose 219 trending down to 126 K+ 4.0 2 L NS, followed by NS infusion of 125, increased to 177ml/hour Insulin drip and D5 1/2NS started Zofran given - controlled nausea Chem panel: Na 135, k+ 4.0, Cl 89, Bicarb 16, BUN 17, Cr 1.55, glucose 219 Heme: Leukocytosis 12.2, Hgb 14, Plt 218 LFTs: AST 179, ALT 68 Covid and flu negative CXR - no acute disease EKG Sinus tach with rate 112, QTc 440 VBG pH 7.3, PCO2 40, PO2 31    Review  of systems:    In addition to the HPI above,  No Fever-chills, No Headache, No changes with Vision or hearing, No problems swallowing food or Liquids, No Chest pain, Cough Positive for Shortness of Breath, Positive for Abdominal pain, Nausea, Vomiting, and diarrhea, No Blood in stool or Urine, No dysuria, No new skin rashes or bruises, No new joints pains-aches,  Positive for generalized weakness, Np tingling, numbness in any extremity, No recent weight gain or loss, No polyuria, polydypsia or polyphagia, No significant Mental Stressors.  All other systems reviewed and are negative.    Past History of the following :    Past Medical History:  Diagnosis Date  . Type 1 diabetes (HCC)       History reviewed. No pertinent surgical history.    Social History:      Social History   Tobacco Use  . Smoking status: Current Every Day Smoker  . Smokeless tobacco: Never Used  Substance Use Topics  . Alcohol use: Yes       Family History :    No family history on file. Patient  reports no family history   Home Medications:   Prior to Admission medications   Medication Sig Start Date End Date Taking? Authorizing Provider  insulin NPH Human (NOVOLIN N) 100 UNIT/ML injection Inject 15 Units into the skin in the morning and at bedtime.   Yes [provider]  insulin regular (NOVOLIN R) 100 units/mL injection Inject 5-10 Units into the skin 3 (three) times daily before meals. Per sliding scale   Yes [provider]     Allergies:    No Known Allergies   Physical Exam:   Vitals  Blood pressure (!) 174/108, pulse (!) 145, temperature 97.9 F (36.6 C), temperature source Oral, resp. rate (!) 31, height 5\' 11"  (1.803 m), weight 68 kg, SpO2 95 %.  1.  General: Laying supine in bed in no acute distress  2. Psychiatric: Poor insight and judgement re: self care + EtOH dependence Behavior appropriate for situation  3. Neurologic: No focal deficit on exam. CN II-XII grossly intact. A&O x3  4. HEENMT:  Head atraumatic normocephalic Mouth - dry Neck supple without, no palpable mass, normal ROM Eyes - conjunctiva clear, pupils reactive to light  5. Respiratory : Lungs to to auscultation BL  6. Cardiovascular : HRRR No murmurs  7. Gastrointestinal:  Tender to palpation at epigastrium and right upper quadrant No guarding No palpable masses  8. Skin:  No acute lesions or rashes on limited skin exam  9.Musculoskeletal:  No peripheral edema or tenderness to palpation of calves    Data Review:    CBC Recent Labs  Lab 03/09/19 1729  WBC 12.2*  HGB 14.4  HCT 41.8  PLT 218  MCV 106.9*  MCH 36.8*  MCHC 34.4  RDW 12.0  LYMPHSABS 0.4*  MONOABS 0.5  EOSABS 0.0  BASOSABS 0.0   ------------------------------------------------------------------------------------------------------------------  Results for orders placed or performed during the hospital encounter of 03/09/19 (from the past 48 hour(s))  CBG monitoring, ED      Status: Abnormal   Collection Time: 03/09/19  4:57 PM  Result Value Ref Range   Glucose-Capillary 252 (H) 70 - 99 mg/dL  CBC with Differential     Status: Abnormal   Collection Time: 03/09/19  5:29 PM  Result Value Ref Range   WBC 12.2 (H) 4.0 - 10.5 K/uL   RBC 3.91 (L) 4.22 - 5.81 MIL/uL   Hemoglobin 14.4 13.0 -  17.0 g/dL   HCT 95.641.8 21.339.0 - 08.652.0 %   MCV 106.9 (H) 80.0 - 100.0 fL   MCH 36.8 (H) 26.0 - 34.0 pg   MCHC 34.4 30.0 - 36.0 g/dL   RDW 57.812.0 46.911.5 - 62.915.5 %   Platelets 218 150 - 400 K/uL   nRBC 0.0 0.0 - 0.2 %   Neutrophils Relative % 91 %   Neutro Abs 11.1 (H) 1.7 - 7.7 K/uL   Lymphocytes Relative 4 %   Lymphs Abs 0.4 (L) 0.7 - 4.0 K/uL   Monocytes Relative 4 %   Monocytes Absolute 0.5 0.1 - 1.0 K/uL   Eosinophils Relative 0 %   Eosinophils Absolute 0.0 0.0 - 0.5 K/uL   Basophils Relative 0 %   Basophils Absolute 0.0 0.0 - 0.1 K/uL   Immature Granulocytes 1 %   Abs Immature Granulocytes 0.09 (H) 0.00 - 0.07 K/uL    Comment: Performed at [facility], [address] [place], [place] [address]  Comprehensive metabolic panel     Status: Abnormal   Collection Time: 03/09/19  5:29 PM  Result Value Ref Range   Sodium 135 135 - 145 mmol/L   Potassium 4.0 3.5 - 5.1 mmol/L   Chloride 89 (L) 98 - 111 mmol/L   CO2 16 (L) 22 - 32 mmol/L   Glucose, Bld 219 (H) 70 - 99 mg/dL   BUN 17 6 - 20 mg/dL   Creatinine, Ser 3.241.55 (H) 0.61 - 1.24 mg/dL   Calcium 8.8 (L) 8.9 - 10.3 mg/dL   Total Protein 8.0 6.5 - 8.1 g/dL   Albumin 4.5 3.5 - 5.0 g/dL   AST 401179 (H) 15 - 41 U/L   ALT 68 (H) 0 - 44 U/L   Alkaline Phosphatase 153 (H) 38 - 126 U/L   Total Bilirubin 2.0 (H) 0.3 - 1.2 mg/dL   GFR calc non Af Amer 56 (L) >60 mL/min   GFR calc Af Amer >60 >60 mL/min   Anion gap 30 (H) 5 - 15    Comment: Performed at [facility], [address] [place], [place] [address]  Lipase, blood     Status: None   Collection Time: 03/09/19  5:29 PM  Result Value Ref Range   Lipase 31 11 - 51 U/L     Comment: Performed at [facility], [address] [place], [place] [address]  Ethanol     Status: None   Collection Time: 03/09/19  5:29 PM  Result Value Ref Range   Alcohol, Ethyl (B) <10 <10 mg/dL    Comment: (NOTE) Lowest detectable limit for serum alcohol is 10 mg/dL. For medical purposes only. Performed at [facility], [address] [place], [place] [address]   Respiratory Panel by RT PCR (Flu A&B, Covid) - Nasopharyngeal Swab     Status: None   Collection Time: 03/09/19  5:29 PM   Specimen: Nasopharyngeal Swab  Result Value Ref Range   SARS Coronavirus 2 by RT PCR NEGATIVE NEGATIVE    Comment: (NOTE) SARS-CoV-2 target nucleic acids are NOT DETECTED. The SARS-CoV-2 RNA is generally detectable in upper respiratoy specimens during the acute phase of infection. The lowest concentration of SARS-CoV-2 viral copies this assay can detect is 131 copies/mL. A negative result does not preclude SARS-Cov-2 infection and should not be used as the sole basis for treatment or other patient management decisions. A negative result may occur with  improper specimen collection/handling, submission of specimen other than nasopharyngeal swab, presence of viral  mutation(s) within the areas targeted by this assay, and inadequate number of viral copies (<131 copies/mL). A negative result must be combined with clinical observations, patient history, and epidemiological information. The expected result is Negative. Fact Sheet for Patients:  https://www.moore.com/ Fact Sheet for Healthcare Providers:  https://www.young.biz/ This test is not yet ap proved or cleared by the [place] FDA and  has been authorized for detection and/or diagnosis of SARS-CoV-2 by FDA under an Emergency Use Authorization (EUA). This EUA will remain  in effect (meaning this test can be used) for the duration of the COVID-19 declaration under Section 564(b)(1) of the Act, 21 U.S.C.  section 360bbb-3(b)(1), unless the authorization is terminated or revoked sooner.    Influenza A by PCR NEGATIVE NEGATIVE   Influenza B by PCR NEGATIVE NEGATIVE    Comment: (NOTE) The Xpert Xpress SARS-CoV-2/FLU/RSV assay is intended as an aid in  the diagnosis of influenza from Nasopharyngeal swab specimens and  should not be used as a sole basis for treatment. Nasal washings and  aspirates are unacceptable for Xpert Xpress SARS-CoV-2/FLU/RSV  testing. Fact Sheet for Patients: https://www.moore.com/ Fact Sheet for Healthcare Providers: https://www.young.biz/ This test is not yet approved or cleared by the [place] FDA and  has been authorized for detection and/or diagnosis of SARS-CoV-2 by  FDA under an Emergency Use Authorization (EUA). This EUA will remain  in effect (meaning this test can be used) for the duration of the  Covid-19 declaration under Section 564(b)(1) of the Act, 21  U.S.C. section 360bbb-3(b)(1), unless the authorization is  terminated or revoked. Performed at [facility], [address] [place], [place] [address]   Blood gas, venous     Status: Abnormal   Collection Time: 03/09/19  6:12 PM  Result Value Ref Range   FIO2 21.00    pH, Ven 7.316 7.250 - 7.430   pCO2, Ven 40.1 (L) 44.0 - 60.0 mmHg   pO2, Ven <31.0 (LL) 32.0 - 45.0 mmHg    Comment: CRITICAL RESULT CALLED TO, READ BACK BY AND VERIFIED WITH: WHITE,M. AT 1844 ON 03/09/2019 BY EVA    Bicarbonate 18.6 (L) 20.0 - 28.0 mmol/L   Acid-base deficit 5.2 (H) 0.0 - 2.0 mmol/L   O2 Saturation 27.3 %   Patient temperature 36.6     Comment: Performed at [facility], [address], [place], [place] [address]  Urinalysis, Routine w reflex microscopic     Status: Abnormal   Collection Time: 03/09/19  6:26 PM  Result Value Ref Range   Color, Urine YELLOW YELLOW   APPearance HAZY (A) CLEAR   Specific Gravity, Urine 1.014 1.005 - 1.030   pH 5.0 5.0 - 8.0   Glucose,  UA >=500 (A) NEGATIVE mg/dL   Hgb urine dipstick MODERATE (A) NEGATIVE   Bilirubin Urine NEGATIVE NEGATIVE   Ketones, ur 80 (A) NEGATIVE mg/dL   Protein, ur 732 (A) NEGATIVE mg/dL   Nitrite NEGATIVE NEGATIVE   Leukocytes,Ua NEGATIVE NEGATIVE   WBC, UA 0-5 0 - 5 WBC/hpf   Bacteria, UA NONE SEEN NONE SEEN   Squamous Epithelial / LPF 0-5 0 - 5   Mucus PRESENT     Comment: Performed at [facility], [address], [place], [place] [address]  CBG monitoring, ED     Status: Abnormal   Collection Time: 03/09/19  7:25 PM  Result Value Ref Range   Glucose-Capillary 126 (H) 70 - 99 mg/dL    Chemistries  Recent Labs  Lab 03/09/19 1729  NA 135  K 4.0  CL 89*  CO2 16*  GLUCOSE 219*  BUN 17  CREATININE 1.55*  CALCIUM 8.8*  AST 179*  ALT 68*  ALKPHOS 153*  BILITOT 2.0*   ------------------------------------------------------------------------------------------------------------------  ------------------------------------------------------------------------------------------------------------------ GFR: Estimated Creatinine Clearance: 62.2 mL/min (A) (by C-G formula based on SCr of 1.55 mg/dL (H)). Liver Function Tests: Recent Labs  Lab 03/09/19 1729  AST 179*  ALT 68*  ALKPHOS 153*  BILITOT 2.0*  PROT 8.0  ALBUMIN 4.5   Recent Labs  Lab 03/09/19 1729  LIPASE 31   No results for input(s): AMMONIA in the last 168 hours. Coagulation Profile: No results for input(s): INR, PROTIME in the last 168 hours. Cardiac Enzymes: No results for input(s): CKTOTAL, CKMB, CKMBINDEX, TROPONINI in the last 168 hours. BNP (last 3 results) No results for input(s): PROBNP in the last 8760 hours. HbA1C: No results for input(s): HGBA1C in the last 72 hours. CBG: Recent Labs  Lab 03/09/19 1657 03/09/19 1925  GLUCAP 252* 126*   Lipid Profile: No results for input(s): CHOL, HDL, LDLCALC, TRIG, CHOLHDL, LDLDIRECT in the last 72 hours. Thyroid Function Tests: No results for input(s):  TSH, T4TOTAL, FREET4, T3FREE, THYROIDAB in the last 72 hours. Anemia Panel: No results for input(s): VITAMINB12, FOLATE, FERRITIN, TIBC, IRON, RETICCTPCT in the last 72 hours.  --------------------------------------------------------------------------------------------------------------- Urine analysis:    Component Value Date/Time   COLORURINE YELLOW 03/09/2019 1826   APPEARANCEUR HAZY (A) 03/09/2019 1826   LABSPEC 1.014 03/09/2019 1826   PHURINE 5.0 03/09/2019 1826   GLUCOSEU >=500 (A) 03/09/2019 1826   HGBUR MODERATE (A) 03/09/2019 1826   BILIRUBINUR NEGATIVE 03/09/2019 1826   KETONESUR 80 (A) 03/09/2019 1826   PROTEINUR 100 (A) 03/09/2019 1826   UROBILINOGEN 0.2 01/25/2009 0222   NITRITE NEGATIVE 03/09/2019 1826   LEUKOCYTESUR NEGATIVE 03/09/2019 1826      Imaging Results:    DG Chest Port 1 View  Result Date: 03/09/2019 CLINICAL DATA:  Shortness of breath and vomiting. EXAM: PORTABLE CHEST 1 VIEW COMPARISON:  July 29, 2018 FINDINGS: The heart size and mediastinal contours are within normal limits. Both lungs are clear. The visualized skeletal structures are unremarkable. IMPRESSION: No active disease. Electronically Signed   By: Sherian Rein M.D.   On: 03/09/2019 17:25      Assessment & Plan:    Active Problems:   DKA (diabetic ketoacidoses) (HCC)   1. DKA 1. PH 7.3, Gap 30, Bicarb 16, Glucose 219 2. DKA protocol, insulin drip with D5-1/2NS, fingerstick glucoses and q4BMP 3. Consider DM education in the AM 4. K+ 4.0, 2 runs K+ ordered per protocol 5. Monitor in ICU 2. AKI 1. Likely pre-renal in the setting of DKA - hypovolemic status 2. Continue hydration 3. Avoid nephrotoxic agents when possible 4. Trend with q4 BMP 3. transaminitis 1. Likely due to EtOH use 2. Acute Hep panel to rule out infectious hepatitis 3. Continue to monitor 4. Tobacco use disorder 1. Educated on potential complications with continued smoking 2. Patient is in pre-contemplative  stage 3. Nicotine patch 5. EtOH dependence 1. CIWA protocol 6. DMI 1. See plan for #1 2. Hgb A1C pending    DVT Prophylaxis-   Heparin - SCDs   AM Labs Ordered, also please review Full Orders  Family Communication: No family at bedside Code Status:  Full  Admission status: Inpatient: Based on patients clinical presentation and evaluation of above clinical data, I have made determination that patient meets Inpatient criteria at this time.  Time spent in minutes : 65   Gunter Conde B Zierle-Ghosh M.D

## 2019-03-09 NOTE — ED Provider Notes (Addendum)
[facility] EMERGENCY DEPARTMENT Provider Note   CSN: [id number] Arrival date & time: 03/09/19  1645     History Chief Complaint  Patient presents with  . Emesis    Tommy Martinez is a 39 y.o. male.  Tommy Martinez is a 39 y.o. male with history of type 1 diabetes and previous DKA, who presents to the ED for emesis.  He reports vomiting started around 3 PM and has been persistent.  It is associated with diarrhea and generalized abdominal pain.  Patient reports this feels like when he has had DKA in the past, last episode about 1 year ago.  He states he is a type I diabetic but does not check his sugar regularly, gives insulin based on his symptoms and what he is eating and drinking.  States that since early this morning he has given himself 95 units of insulin and multiple doses, the last dose was 15 units just prior to arrival.  He states this has not stopped his vomiting.  He states his DKA is often caused by alcohol use, he states that he drinks daily, but drank heavily yesterday.  He denies any associated fevers or chills.  No blood in his vomit or stool.  No chest pain, shortness of breath or cough.  No recent sick contacts.  No other aggravating or alleviating factors.  The history is provided by the patient and medical records.       Past Medical History:  Diagnosis Date  . Type 1 diabetes [facility])     Patient Active Problem List   Diagnosis Date Noted  . DKA (diabetic ketoacidoses) (HCC) 03/09/2019    History reviewed. No pertinent surgical history.     No family history on file.  Social History   Tobacco Use  . Smoking status: Current Every Day Smoker  . Smokeless tobacco: Never Used  Substance Use Topics  . Alcohol use: Yes  . Drug use: Not Currently    Home Medications Prior to Admission medications   Medication Sig Start Date End Date Taking? Authorizing Provider  insulin NPH Human (NOVOLIN N) 100 UNIT/ML injection Inject 15 Units into the skin in the morning and  at bedtime.   Yes [provider]  insulin regular (NOVOLIN R) 100 units/mL injection Inject 5-10 Units into the skin 3 (three) times daily before meals. Per sliding scale   Yes [provider]    Allergies    Patient has no known allergies.  Review of Systems   Review of Systems  Constitutional: Negative for chills and fever.  HENT: Negative.   Respiratory: Negative for cough and shortness of breath.   Cardiovascular: Negative for chest pain.  Gastrointestinal: Positive for abdominal pain, diarrhea, nausea and vomiting. Negative for blood in stool.  Genitourinary: Negative for dysuria and frequency.  Musculoskeletal: Negative for arthralgias and myalgias.  Skin: Negative for color change and rash.  Neurological: Negative for dizziness, syncope and light-headedness.    Physical Exam Updated Vital Signs BP (!) 148/96 (BP Location: Right Arm)   Pulse (!) 135   Temp 97.9 F (36.6 C) (Oral)   Resp (!) 24   Ht 5\' 11"  (1.803 m)   Wt 68 kg   SpO2 99%   BMI 20.92 kg/m   Physical Exam Vitals and nursing note reviewed.  Constitutional:      General: He is not in acute distress.    Appearance: He is well-developed. He is ill-appearing. He is not diaphoretic.     Comments:  Patient is alert, somewhat somnolent and ill-appearing, in no acute distress  HENT:     Head: Normocephalic and atraumatic.     Mouth/Throat:     Mouth: Mucous membranes are dry.     Pharynx: Oropharynx is clear.  Eyes:     General:        Right eye: No discharge.        Left eye: No discharge.  Cardiovascular:     Rate and Rhythm: Regular rhythm. Tachycardia present.     Pulses: Normal pulses.     Heart sounds: Normal heart sounds. No murmur. No friction rub. No gallop.      Comments: Tachycardia with regular rhythm Pulmonary:     Effort: Pulmonary effort is normal. No respiratory distress.     Breath sounds: Normal breath sounds. No wheezing or rales.     Comments: Respirations equal  and unlabored, patient able to speak in full sentences, lungs clear to auscultation bilaterally Abdominal:     General: Bowel sounds are normal. There is no distension.     Palpations: Abdomen is soft. There is no mass.     Tenderness: There is abdominal tenderness. There is no guarding.     Comments: Abdomen is soft and nondistended, there is some mild generalized tenderness throughout but no focal area of pain, no guarding or rigidity  Musculoskeletal:        General: No deformity.     Cervical back: Neck supple.  Skin:    General: Skin is warm and dry.     Capillary Refill: Capillary refill takes less than 2 seconds.  Neurological:     Mental Status: He is alert and oriented to person, place, and time.     Coordination: Coordination normal.     Comments: Speech is clear, able to follow commands Moves extremities without ataxia, coordination intact  Psychiatric:        Mood and Affect: Mood normal.        Behavior: Behavior normal.     ED Results / Procedures / Treatments   Labs (all labs ordered are listed, but only abnormal results are displayed) Labs Reviewed  CBC WITH DIFFERENTIAL/PLATELET - Abnormal; Notable for the following components:      Result Value   WBC 12.2 (*)    RBC 3.91 (*)    MCV 106.9 (*)    MCH 36.8 (*)    Neutro Abs 11.1 (*)    Lymphs Abs 0.4 (*)    Abs Immature Granulocytes 0.09 (*)    All other components within normal limits  COMPREHENSIVE METABOLIC PANEL - Abnormal; Notable for the following components:   Chloride 89 (*)    CO2 16 (*)    Glucose, Bld 219 (*)    Creatinine, Ser 1.55 (*)    Calcium 8.8 (*)    AST 179 (*)    ALT 68 (*)    Alkaline Phosphatase 153 (*)    Total Bilirubin 2.0 (*)    GFR calc non Af Amer 56 (*)    Anion gap 30 (*)    All other components within normal limits  URINALYSIS, ROUTINE W REFLEX MICROSCOPIC - Abnormal; Notable for the following components:   APPearance HAZY (*)    Glucose, UA >=500 (*)    Hgb urine  dipstick MODERATE (*)    Ketones, ur 80 (*)    Protein, ur 100 (*)    All other components within normal limits  BLOOD GAS, VENOUS - Abnormal; Notable for  the following components:   pCO2, Ven 40.1 (*)    pO2, Ven <31.0 (*)    Bicarbonate 18.6 (*)    Acid-base deficit 5.2 (*)    All other components within normal limits  CBG MONITORING, ED - Abnormal; Notable for the following components:   Glucose-Capillary 252 (*)    All other components within normal limits  CBG MONITORING, ED - Abnormal; Notable for the following components:   Glucose-Capillary 126 (*)    All other components within normal limits  RESPIRATORY PANEL BY RT PCR (FLU A&B, COVID)  LIPASE, BLOOD  ETHANOL    EKG EKG Interpretation  Date/Time:  Saturday March 09 2019 17:04:25 EST Ventricular Rate:  112 PR Interval:    QRS Duration: 81 QT Interval:  322 QTC Calculation: 440 R Axis:   97 Text Interpretation: Sinus tachycardia Borderline right axis deviation ST elev, probable normal early repol pattern since last tracing no significant change Confirmed by Daleen Bo [phone]) on 03/09/2019 5:18:18 PM   Radiology DG Chest Port 1 View  Result Date: 03/09/2019 CLINICAL DATA:  Shortness of breath and vomiting. EXAM: PORTABLE CHEST 1 VIEW COMPARISON:  July 29, 2018 FINDINGS: The heart size and mediastinal contours are within normal limits. Both lungs are clear. The visualized skeletal structures are unremarkable. IMPRESSION: No active disease. Electronically Signed   By: Abelardo Diesel M.D.   On: 03/09/2019 17:25    Procedures .Critical Care Performed by: Jacqlyn Larsen, PA-C Authorized by: Jacqlyn Larsen, PA-C   Critical care provider statement:    Critical care time (minutes):  45   Critical care was necessary to treat or prevent imminent or life-threatening deterioration of the following conditions:  Metabolic crisis (DKA)   Critical care was time spent personally by me on the following activities:   Discussions with consultants, evaluation of patient's response to treatment, examination of patient, ordering and performing treatments and interventions, ordering and review of laboratory studies, ordering and review of radiographic studies, pulse oximetry, re-evaluation of patient's condition, obtaining history from patient or surrogate and review of old charts   (including critical care time)  Medications Ordered in ED Medications  insulin regular, human (MYXREDLIN) 100 units/ 100 mL infusion (0 Units/hr Intravenous Hold 03/09/19 1955)  0.9 %  sodium chloride infusion (150 mL/hr Intravenous Rate/Dose Change 03/09/19 1954)  dextrose 5 %-0.45 % sodium chloride infusion (75 mL/hr Intravenous New Bag/Given 03/09/19 1947)  dextrose 50 % solution 0-50 mL (has no administration in time range)  potassium chloride 10 mEq in 100 mL IVPB (10 mEq Intravenous New Bag/Given 03/09/19 1944)  phenol (CHLORASEPTIC) mouth spray 1 spray (has no administration in time range)  ondansetron (ZOFRAN) injection 4 mg (has no administration in time range)  prochlorperazine (COMPAZINE) injection 10 mg (has no administration in time range)  sodium chloride 0.9 % bolus 2,000 mL (0 mLs Intravenous Stopped 03/09/19 1922)  ondansetron (ZOFRAN) injection 4 mg (4 mg Intravenous Given by Other 03/09/19 1723)    ED Course  I have reviewed the triage vital signs and the nursing notes.  Pertinent labs & imaging results that were available during my care of the patient were reviewed by me and considered in my medical decision making (see chart for details).    MDM Rules/Calculators/A&P                     39 year old male arrives with vomiting, he is concerned he is in DKA, is a type I diabetic, states he  does not regularly check his blood sugar and gives himself insulin based upon his symptoms.  He is given himself 95 units of insulin intermittently throughout the day, but continues to feel poorly and has had persistent vomiting  since 3 PM.  States he was in DKA last year ago, and this was thought to be related to his alcohol use, states that he drank very heavily last night, does drink most days.  He is tachycardic and ill-appearing on arrival with active vomiting.  No associated fevers or sick contacts, generalized abdominal tenderness on exam which he states is typical for him when in DKA.  Lungs clear.  Will get basic labs, urinalysis, VBG.  Patient started on 2 L fluid bolus, awaiting CMP to assess anion gap but I suspect patient is in DKA.  Currently he is awake and alert, able to answer questions and maintaining his airway.  Kussmal breathing as expected.  Blood sugar of 252 on arrival I suspect this is improved due to patient's treatment with insulin at home but still suspect DKA, CMP returns with anion gap of 30, CO2 of 16, glucose of 219, creatinine is bumped as well likely in the setting of dehydration.  Patient has elevation of LFTs, likely in the setting of heavy alcohol use.  Mild leukocytosis of 12.2.  Urinalysis with ketones present, no signs of infection.  Fortunately pH is 7.316.  Patient started on IV insulin drip, will admit to medicine for DKA.  Case discussed with Dr. Carren Rang with [facility] hospitalist who will see and admit the patient.  Final Clinical Impression(s) / ED Diagnoses Final diagnoses:  Diabetic ketoacidosis without coma associated with type 1 diabetes mellitus [facility])    Rx / DC Orders ED Discharge Orders    None       Legrand Rams 03/09/19 2157    Mancel Bale, MD 03/09/19 2329    Dartha Lodge, PA-C 03/19/19 1831    Mancel Bale, MD 03/20/19 [phone]

## 2019-03-09 NOTE — Progress Notes (Signed)
Transition orders placed. Gap closed. Bicarb 27. Glucose 85. Diet placed. AKI resolved as well.  Results for ALDRED, MASE (MRN [id number]) as of 03/09/2019 22:28  Ref. Range 03/09/2019 21:38  Sodium Latest Ref Range: 135 - 145 mmol/L 138  Potassium Latest Ref Range: 3.5 - 5.1 mmol/L 4.1  Chloride Latest Ref Range: 98 - 111 mmol/L 98  CO2 Latest Ref Range: 22 - 32 mmol/L 27  Glucose Latest Ref Range: 70 - 99 mg/dL 85  BUN Latest Ref Range: 6 - 20 mg/dL 14  Creatinine Latest Ref Range: 0.61 - 1.24 mg/dL 4.17  Calcium Latest Ref Range: 8.9 - 10.3 mg/dL 8.4 (L)  Anion gap Latest Ref Range: 5 - 15  13  GFR, Est Non African American Latest Ref Range: >60 mL/min >60  GFR, Est African American Latest Ref Range: >60 mL/min >60

## 2019-03-09 NOTE — ED Triage Notes (Signed)
Pt c/o vomiting since 03:00 today along with diarrhea and abd pain. Pt reports that he feels like he is in DKA.

## 2019-03-09 NOTE — ED Notes (Signed)
CRITICAL VALUE ALERT  Critical Value:  PO2 <31.0  Date & Time Notied:  03/09/19 1845  Provider Notified: Jodi Geralds, PA  Orders Received/Actions taken: EDP notified

## 2019-03-10 ENCOUNTER — Encounter (HOSPITAL_COMMUNITY): Payer: Self-pay | Admitting: Family Medicine

## 2019-03-10 DIAGNOSIS — E1010 Type 1 diabetes mellitus with ketoacidosis without coma: Principal | ICD-10-CM

## 2019-03-10 LAB — BASIC METABOLIC PANEL
Anion gap: 12 (ref 5–15)
Anion gap: 15 (ref 5–15)
BUN: 11 mg/dL (ref 6–20)
BUN: 10 mg/dL (ref 6–20)
CO2: 26 mmol/L (ref 22–32)
CO2: 22 mmol/L (ref 22–32)
Calcium: 8.5 mg/dL — ABNORMAL LOW (ref 8.9–10.3)
Calcium: 8.8 mg/dL — ABNORMAL LOW (ref 8.9–10.3)
Chloride: 100 mmol/L (ref 98–111)
Chloride: 99 mmol/L (ref 98–111)
Creatinine, Ser: 0.82 mg/dL (ref 0.61–1.24)
Creatinine, Ser: 0.98 mg/dL (ref 0.61–1.24)
GFR calc Af Amer: >60 mL/min (ref >60)
GFR calc Af Amer: >60 mL/min (ref >60)
GFR calc non Af Amer: >60 mL/min (ref >60)
GFR calc non Af Amer: >60 mL/min (ref >60)
Glucose, Bld: 157 mg/dL — ABNORMAL HIGH (ref 70–99)
Glucose, Bld: 218 mg/dL — ABNORMAL HIGH (ref 70–99)
Potassium: 3.5 mmol/L (ref 3.5–5.1)
Potassium: 3.6 mmol/L (ref 3.5–5.1)
Sodium: 138 mmol/L (ref 135–145)
Sodium: 136 mmol/L (ref 135–145)

## 2019-03-10 LAB — HEMOGLOBIN A1C
Hgb A1c MFr Bld: 8.7 % — ABNORMAL HIGH (ref 4.8–5.6)
Mean Plasma Glucose: 202.99 mg/dL

## 2019-03-10 LAB — HEPATITIS PANEL, ACUTE
HCV Ab: NONREACTIVE
Hep A IgM: NONREACTIVE
Hep B C IgM: NONREACTIVE
Hepatitis B Surface Ag: NONREACTIVE

## 2019-03-10 LAB — COMPREHENSIVE METABOLIC PANEL
ALT: 53 U/L — ABNORMAL HIGH (ref 0–44)
AST: 121 U/L — ABNORMAL HIGH (ref 15–41)
Albumin: 3.8 g/dL (ref 3.5–5.0)
Alkaline Phosphatase: 110 U/L (ref 38–126)
Anion gap: 13 (ref 5–15)
BUN: 12 mg/dL (ref 6–20)
CO2: 26 mmol/L (ref 22–32)
Calcium: 8.2 mg/dL — ABNORMAL LOW (ref 8.9–10.3)
Chloride: 100 mmol/L (ref 98–111)
Creatinine, Ser: 0.88 mg/dL (ref 0.61–1.24)
GFR calc Af Amer: >60 mL/min (ref >60)
GFR calc non Af Amer: >60 mL/min (ref >60)
Glucose, Bld: 121 mg/dL — ABNORMAL HIGH (ref 70–99)
Potassium: 4.1 mmol/L (ref 3.5–5.1)
Sodium: 139 mmol/L (ref 135–145)
Total Bilirubin: 0.9 mg/dL (ref 0.3–1.2)
Total Protein: 6.8 g/dL (ref 6.5–8.1)

## 2019-03-10 LAB — GLUCOSE, CAPILLARY
Glucose-Capillary: 106 mg/dL — ABNORMAL HIGH (ref 70–99)
Glucose-Capillary: 188 mg/dL — ABNORMAL HIGH (ref 70–99)
Glucose-Capillary: 196 mg/dL — ABNORMAL HIGH (ref 70–99)
Glucose-Capillary: 229 mg/dL — ABNORMAL HIGH (ref 70–99)
Glucose-Capillary: 237 mg/dL — ABNORMAL HIGH (ref 70–99)
Glucose-Capillary: 82 mg/dL (ref 70–99)
Glucose-Capillary: 96 mg/dL (ref 70–99)

## 2019-03-10 LAB — CBC
HCT: 35.8 % — ABNORMAL LOW (ref 39.0–52.0)
Hemoglobin: 12.9 g/dL — ABNORMAL LOW (ref 13.0–17.0)
MCH: 36.8 pg — ABNORMAL HIGH (ref 26.0–34.0)
MCHC: 36 g/dL (ref 30.0–36.0)
MCV: 102 fL — ABNORMAL HIGH (ref 80.0–100.0)
Platelets: 174 10*3/uL (ref 150–400)
RBC: 3.51 MIL/uL — ABNORMAL LOW (ref 4.22–5.81)
RDW: 11.7 % (ref 11.5–15.5)
WBC: 11.6 10*3/uL — ABNORMAL HIGH (ref 4.0–10.5)
nRBC: 0 % (ref 0.0–0.2)

## 2019-03-10 LAB — MRSA PCR SCREENING: MRSA by PCR: POSITIVE — AB

## 2019-03-10 LAB — HIV ANTIBODY (ROUTINE TESTING W REFLEX): HIV Screen 4th Generation wRfx: NONREACTIVE

## 2019-03-10 LAB — PHOSPHORUS: Phosphorus: 2.2 mg/dL — ABNORMAL LOW (ref 2.5–4.6)

## 2019-03-10 LAB — BETA-HYDROXYBUTYRIC ACID
Beta-Hydroxybutyric Acid: 1.09 mmol/L — ABNORMAL HIGH (ref 0.05–0.27)
Beta-Hydroxybutyric Acid: 1.73 mmol/L — ABNORMAL HIGH (ref 0.05–0.27)
Beta-Hydroxybutyric Acid: 4.72 mmol/L — ABNORMAL HIGH (ref 0.05–0.27)

## 2019-03-10 LAB — MAGNESIUM
Magnesium: 1.2 mg/dL — ABNORMAL LOW (ref 1.7–2.4)
Magnesium: 2.3 mg/dL (ref 1.7–2.4)

## 2019-03-10 MED ORDER — CHLORHEXIDINE GLUCONATE CLOTH 2 % EX PADS
6.0000 | MEDICATED_PAD | Freq: Every day | CUTANEOUS | Status: DC
  Administered 2019-03-10: 10:00:00 6 via TOPICAL

## 2019-03-10 MED ORDER — AZITHROMYCIN 250 MG PO TABS
500.0000 mg | ORAL_TABLET | Freq: Every day | ORAL | Status: DC
  Administered 2019-03-10 – 2019-03-11 (×2): 500 mg via ORAL
  Filled 2019-03-10 (×2): qty 2

## 2019-03-10 MED ORDER — INSULIN ASPART 100 UNIT/ML ~~LOC~~ SOLN
0.0000 [IU] | SUBCUTANEOUS | Status: DC
  Administered 2019-03-10 (×2): 5 [IU] via SUBCUTANEOUS
  Administered 2019-03-10 – 2019-03-11 (×4): 3 [IU] via SUBCUTANEOUS

## 2019-03-10 MED ORDER — BUTAMBEN-TETRACAINE-BENZOCAINE 2-2-14 % EX AERO
1.0000 | INHALATION_SPRAY | Freq: Once | CUTANEOUS | Status: AC
  Administered 2019-03-10: 10:00:00 1 via TOPICAL
  Filled 2019-03-10: qty 20

## 2019-03-10 MED ORDER — LACTATED RINGERS IV SOLN: INTRAVENOUS | Status: DC

## 2019-03-10 MED ORDER — INSULIN GLARGINE 100 UNIT/ML ~~LOC~~ SOLN
10.0000 [IU] | Freq: Every day | SUBCUTANEOUS | Status: DC
  Administered 2019-03-10: 22:00:00 10 [IU] via SUBCUTANEOUS
  Filled 2019-03-10 (×2): qty 0.1

## 2019-03-10 MED ORDER — K PHOS MONO-SOD PHOS DI & MONO 155-852-130 MG PO TABS
250.0000 mg | ORAL_TABLET | Freq: Once | ORAL | Status: AC
  Administered 2019-03-10: 06:00:00 250 mg via ORAL
  Filled 2019-03-10: qty 1

## 2019-03-10 MED ORDER — MAGNESIUM SULFATE 2 GM/50ML IV SOLN
2.0000 g | Freq: Once | INTRAVENOUS | Status: AC
  Administered 2019-03-10: 06:00:00 2 g via INTRAVENOUS
  Filled 2019-03-10: qty 50

## 2019-03-10 NOTE — Progress Notes (Signed)
Inpatient Diabetes Program Recommendations  AACE/ADA: New Consensus Statement on Inpatient Glycemic Control (2015)  Target Ranges:  Prepandial:   less than 140 mg/dL      Peak postprandial:   less than 180 mg/dL (1-2 hours)      Critically ill patients:  140 - 180 mg/dL   Results for Tommy Martinez, Tommy Martinez (MRN [id number]) as of 03/10/2019 11:27  Ref. Range 03/09/2019 23:21 03/10/2019 00:22 03/10/2019 01:30 03/10/2019 02:25 03/10/2019 07:16  Glucose-Capillary Latest Ref Range: 70 - 99 mg/dL 76  10 units LANTUS given at 11:50pm 82 96 106 (H) 188 (H)  3 units NOVOLOG    Results for Tommy Martinez, Tommy Martinez (MRN [id number]) as of 03/10/2019 11:27  Ref. Range 03/10/2019 02:37  Hemoglobin A1C Latest Ref Range: 4.8 - 5.6 % 8.7 (H)    Admit with: DKA  History: Type 1 Diabetes, ETOH use Daily  Home DM Meds: NPH 15 units BID       Regular 5-10 units TID  Current Orders: Novolog Moderate Correction Scale/ SSI (0-15 units) Q4 hours    PCP: Dr. Assunta Found  Per MD notes, pt does NOT check CBGs at home--Instead "wings it"  Transitioned to SQ Insulin late last PM.   MD- Please make sure to order Lantus insulin for tonight.  Pt got 10 units yesterday around 11:50pm  Will need Lantus for tonight--History of Type 1 diabetes     --Will follow patient during hospitalization--  Ambrose Finland RN, MSN, CDE Diabetes Coordinator Inpatient Glycemic Control Team Team Pager: [phone] (8a-5p)

## 2019-03-10 NOTE — Progress Notes (Signed)
PROGRESS NOTE    Tommy Martinez  SEG:[id number] DOB: January 05, 1981 DOA: 03/09/2019 PCP: Assunta Found, MD   Brief Narrative:  Per HPI: Tommy Martinez  is a 39 y.o. male, with a history of type one diabetes presents with nausea, vomiting, diarrhea, and abdominal pain that started at 3:00 am 03/09/19. Patient reports that he knew he was in DKA because he has had DKA before. He does not check his glucose at home, and reports that he "wings it." In an effort to get himself out of DKA, patient took 95units of short acting insulin throughout the day, but his nausea and vomiting continued, so he decided to come into the ED. Patient reports that his emesis was yellow at first, and then turned clear after he drank a bottle of water. Patient reports that there was no undigested food or blood present. He is not sure when his last normal meal was, but reports that it was sometime on the 12th of Feb. His diarrhea has not been bloody per his report, but he can offer no further details into color, consistency, etc. Patient notes associated epigastric pain that started after he had vomited several times. Patient reports that it feels like heart burn, and it radiates into his throat. Patient reports a lot of pain in his throat - again there was not hematemesis. His last symptom to develop is a productive cough with clear sputum.   Patient's last DKA episode was after a night of heavy drinking. Patient does report that he drinks 0.5 - 1 fifth of EtOH every day. Last night, he reportedly drank "a lot more" than that. He can't quantify how much. Patient has never had a seizure 2/2 EtOH withdrawal, but has developed tremors 2/2 to EtOH withdrawal. His EtOH level at the time of admission was less than 10.  As for patient's DMI management - patient is unclear how much insulin he actually takes. He reports that he usually takes 15u long acting insulin in the AM and PM each day. He thinks he has a carb correction of 2:1. He is not sure  about a post-prandial sliding scale correction. Again, patient is non compliant with his fingerstick glucose checks.   2/14: Patient has been admitted with DKA with associated AKI and transaminitis with history of alcohol use.  He was transitioned to subcutaneous insulin and diet has been initiated this morning.  Unfortunately, his blood glucose as well as beta hydroxybutyrate continues to trend up at the moment.  He will need careful observation as well as ongoing IV fluid.  He has not eaten very much either.  Assessment & Plan:   Active Problems:   DKA (diabetic ketoacidoses) (HCC)   DKA in the setting of type 1 diabetes -Patient was transitioned off IV insulin drip this morning -Unfortunately has beta hydroxybutyrate as well as blood glucose continue to trend up and he will need close monitoring -We will increase sliding scale insulin to every 4 hours and monitor closely as his intake is unreliable -Long-acting insulin to be given tonight -Hemoglobin A1c 8.7%, appears to be poorly compliant  AKI secondary to above -Currently resolved -Continue close monitoring  Transaminitis in the setting of alcohol use -Hepatitis panel is negative -We will order CMP in a.m. to further evaluate  Upper respiratory infection with pharyngitis -Started on azithromycin empirically -Negative Covid and influenza tests  Alcohol dependence -Without any symptoms, but still has some sinus tachycardia -Maintain on CIWA protocol  Tobacco use disorder -Nicotine patch -Counseled on cessation  DVT prophylaxis: Heparin Code Status: Full Family Communication: None at bedside Disposition Plan: Continue close monitoring with anticipated discharge in a.m. once blood glucose levels and beta hydroxy levels are further improved.  He needs to be tolerating diet as well.   Consultants:   None  Procedures:   As noted below  Antimicrobials:  Anti-infectives (From admission, onward)   Start      Dose/Rate Route Frequency Ordered Stop   03/10/19 1000  azithromycin (ZITHROMAX) tablet 500 mg     500 mg Oral Daily 03/10/19 0734         Subjective: Patient seen and evaluated today with sore throat noted.  He also does not appear to have much of an appetite, but denies any abdominal pain, nausea, or vomiting.  He is not exhibiting any tremors or agitation, but continues to have some sinus tachycardia.  Objective: Vitals:   03/10/19 0700 03/10/19 0718 03/10/19 0800 03/10/19 1407  BP: 125/81  (!) 133/102 124/77  Pulse: (!) 107 (!) 108 (!) 104 (!) 102  Resp: 14 (!) 21 16 16   Temp:  98.1 F (36.7 C)  98 F (36.7 C)  TempSrc:  Oral  Oral  SpO2: 96% 98% 98% 99%  Weight:      Height:        Intake/Output Summary (Last 24 hours) at 03/10/2019 1418 Last data filed at 03/10/2019 0800 Gross per 24 hour  Intake 2370.97 ml  Output 1450 ml  Net 920.97 ml   Filed Weights   03/09/19 1652 03/09/19 2307  Weight: 68 kg 69.4 kg    Examination:  General exam: Appears calm and comfortable, pharynx examined with some exudate and erythema noted Respiratory system: Clear to auscultation. Respiratory effort normal. Cardiovascular system: S1 & S2 heard, RRR, tachycardic. No JVD, murmurs, rubs, gallops or clicks. No pedal edema. Gastrointestinal system: Abdomen is nondistended, soft and nontender. No organomegaly or masses felt. Normal bowel sounds heard. Central nervous system: Alert and oriented. No focal neurological deficits. Extremities: Symmetric 5 x 5 power. Skin: No rashes, lesions or ulcers Psychiatry: Judgement and insight appear normal. Mood & affect appropriate.     Data Reviewed: I have personally reviewed following labs and imaging studies  CBC: Recent Labs  Lab 03/09/19 1729 03/10/19 0237  WBC 12.2* 11.6*  NEUTROABS 11.1*  --   HGB 14.4 12.9*  HCT 41.8 35.8*  MCV 106.9* 102.0*  PLT 218 174   Basic Metabolic Panel: Recent Labs  Lab 03/09/19 1729 03/09/19 2138  03/10/19 0237 03/10/19 0918  NA 135 138 139 138  K 4.0 4.1 4.1 3.5  CL 89* 98 100 100  CO2 16* 27 26 26   GLUCOSE 219* 85 121* 157*  BUN 17 14 12 11   CREATININE 1.55* 0.99 0.88 0.82  CALCIUM 8.8* 8.4* 8.2* 8.5*  MG  --   --  1.2* 2.3  PHOS  --   --  2.2*  --    GFR: Estimated Creatinine Clearance: 119.9 mL/min (by C-G formula based on SCr of 0.82 mg/dL). Liver Function Tests: Recent Labs  Lab 03/09/19 1729 03/10/19 0237  AST 179* 121*  ALT 68* 53*  ALKPHOS 153* 110  BILITOT 2.0* 0.9  PROT 8.0 6.8  ALBUMIN 4.5 3.8   Recent Labs  Lab 03/09/19 1729  LIPASE 31   No results for input(s): AMMONIA in the last 168 hours. Coagulation Profile: No results for input(s): INR, PROTIME in the last 168 hours. Cardiac Enzymes: No results for input(s): CKTOTAL, CKMB, CKMBINDEX,  TROPONINI in the last 168 hours. BNP (last 3 results) No results for input(s): PROBNP in the last 8760 hours. HbA1C: Recent Labs    03/10/19 0237  HGBA1C 8.7*   CBG: Recent Labs  Lab 03/10/19 0022 03/10/19 0130 03/10/19 0225 03/10/19 0716 03/10/19 1159  GLUCAP 82 96 106* 188* 196*   Lipid Profile: No results for input(s): CHOL, HDL, LDLCALC, TRIG, CHOLHDL, LDLDIRECT in the last 72 hours. Thyroid Function Tests: No results for input(s): TSH, T4TOTAL, FREET4, T3FREE, THYROIDAB in the last 72 hours. Anemia Panel: No results for input(s): VITAMINB12, FOLATE, FERRITIN, TIBC, IRON, RETICCTPCT in the last 72 hours. Sepsis Labs: No results for input(s): PROCALCITON, LATICACIDVEN in the last 168 hours.  Recent Results (from the past 240 hour(s))  Respiratory Panel by RT PCR (Flu A&B, Covid) - Nasopharyngeal Swab     Status: None   Collection Time: 03/09/19  5:29 PM   Specimen: Nasopharyngeal Swab  Result Value Ref Range Status   SARS Coronavirus 2 by RT PCR NEGATIVE NEGATIVE Final    Comment: (NOTE) SARS-CoV-2 target nucleic acids are NOT DETECTED. The SARS-CoV-2 RNA is generally detectable in  upper respiratoy specimens during the acute phase of infection. The lowest concentration of SARS-CoV-2 viral copies this assay can detect is 131 copies/mL. A negative result does not preclude SARS-Cov-2 infection and should not be used as the sole basis for treatment or other patient management decisions. A negative result may occur with  improper specimen collection/handling, submission of specimen other than nasopharyngeal swab, presence of viral mutation(s) within the areas targeted by this assay, and inadequate number of viral copies (<131 copies/mL). A negative result must be combined with clinical observations, patient history, and epidemiological information. The expected result is Negative. Fact Sheet for Patients:  PinkCheek.be Fact Sheet for Healthcare Providers:  GravelBags.it This test is not yet ap proved or cleared by the [place] FDA and  has been authorized for detection and/or diagnosis of SARS-CoV-2 by FDA under an Emergency Use Authorization (EUA). This EUA will remain  in effect (meaning this test can be used) for the duration of the COVID-19 declaration under Section 564(b)(1) of the Act, 21 U.S.C. section 360bbb-3(b)(1), unless the authorization is terminated or revoked sooner.    Influenza A by PCR NEGATIVE NEGATIVE Final   Influenza B by PCR NEGATIVE NEGATIVE Final    Comment: (NOTE) The Xpert Xpress SARS-CoV-2/FLU/RSV assay is intended as an aid in  the diagnosis of influenza from Nasopharyngeal swab specimens and  should not be used as a sole basis for treatment. Nasal washings and  aspirates are unacceptable for Xpert Xpress SARS-CoV-2/FLU/RSV  testing. Fact Sheet for Patients: PinkCheek.be Fact Sheet for Healthcare Providers: GravelBags.it This test is not yet approved or cleared by the [place] FDA and  has been authorized for  detection and/or diagnosis of SARS-CoV-2 by  FDA under an Emergency Use Authorization (EUA). This EUA will remain  in effect (meaning this test can be used) for the duration of the  Covid-19 declaration under Section 564(b)(1) of the Act, 21  U.S.C. section 360bbb-3(b)(1), unless the authorization is  terminated or revoked. Performed at [facility], [address], [place], [place] [address]   MRSA PCR Screening     Status: Abnormal   Collection Time: 03/09/19 10:41 PM   Specimen: Nasal Mucosa; Nasopharyngeal  Result Value Ref Range Status   MRSA by PCR POSITIVE (A) NEGATIVE Final    Comment:        The GeneXpert  MRSA Assay (FDA approved for NASAL specimens only), is one component of a comprehensive MRSA colonization surveillance program. It is not intended to diagnose MRSA infection nor to guide or monitor treatment for MRSA infections. RESULT CALLED TO, READ BACK BY AND VERIFIED WITH: VILLALOBOS,S @ 0245 ON 03/10/19 BY JUW Performed at [facility], [address] [place], [place] [address]          Radiology Studies: [facility] Chest [facility] 1 View  Result Date: 03/09/2019 CLINICAL DATA:  Shortness of breath and vomiting. EXAM: PORTABLE CHEST 1 VIEW COMPARISON:  July 29, 2018 FINDINGS: The heart size and mediastinal contours are within normal limits. Both lungs are clear. The visualized skeletal structures are unremarkable. IMPRESSION: No active disease. Electronically Signed   By: Sherian Rein M.D.   On: 03/09/2019 17:25        Scheduled Meds: . azithromycin  500 mg Oral Daily  . Chlorhexidine Gluconate Cloth  6 each Topical Daily  . folic acid  1 mg Oral Daily  . heparin  5,000 Units Subcutaneous Q8H  . insulin aspart  0-15 Units Subcutaneous Q4H  . insulin glargine  10 Units Subcutaneous QHS  . LORazepam  0-4 mg Intravenous Q4H   Followed by  . [START ON 03/11/2019] LORazepam  0-4 mg Intravenous Q8H  . multivitamin with minerals  1 tablet Oral Daily  . nicotine  7 mg  Transdermal Daily  . thiamine  100 mg Oral Daily   Or  . thiamine  100 mg Intravenous Daily   Continuous Infusions: . sodium chloride 100 mL/hr at 03/10/19 0707  . dextrose 5 % and 0.45% NaCl 100 mL/hr at 03/09/19 2330  . famotidine (PEPCID) IV Stopped (03/09/19 2227)     LOS: 1 day    Time spent: 30 minutes    Avo Schlachter Hoover Brunette, DO [facility] Pager [phone]  If 7PM-7AM, please contact night-coverage [email or web address] Password [facility] 03/10/2019, 2:18 PM

## 2019-03-11 LAB — COMPREHENSIVE METABOLIC PANEL
ALT: 37 U/L (ref 0–44)
AST: 49 U/L — ABNORMAL HIGH (ref 15–41)
Albumin: 3.4 g/dL — ABNORMAL LOW (ref 3.5–5.0)
Alkaline Phosphatase: 103 U/L (ref 38–126)
Anion gap: 13 (ref 5–15)
BUN: 7 mg/dL (ref 6–20)
CO2: 25 mmol/L (ref 22–32)
Calcium: 8.9 mg/dL (ref 8.9–10.3)
Chloride: 99 mmol/L (ref 98–111)
Creatinine, Ser: 0.81 mg/dL (ref 0.61–1.24)
GFR calc Af Amer: >60 mL/min (ref >60)
GFR calc non Af Amer: >60 mL/min (ref >60)
Glucose, Bld: 151 mg/dL — ABNORMAL HIGH (ref 70–99)
Potassium: 3.6 mmol/L (ref 3.5–5.1)
Sodium: 137 mmol/L (ref 135–145)
Total Bilirubin: 1.4 mg/dL — ABNORMAL HIGH (ref 0.3–1.2)
Total Protein: 6.5 g/dL (ref 6.5–8.1)

## 2019-03-11 LAB — CBC
HCT: 35.5 % — ABNORMAL LOW (ref 39.0–52.0)
Hemoglobin: 12.3 g/dL — ABNORMAL LOW (ref 13.0–17.0)
MCH: 36.2 pg — ABNORMAL HIGH (ref 26.0–34.0)
MCHC: 34.6 g/dL (ref 30.0–36.0)
MCV: 104.4 fL — ABNORMAL HIGH (ref 80.0–100.0)
Platelets: 106 10*3/uL — ABNORMAL LOW (ref 150–400)
RBC: 3.4 MIL/uL — ABNORMAL LOW (ref 4.22–5.81)
RDW: 12.1 % (ref 11.5–15.5)
WBC: 7.4 10*3/uL (ref 4.0–10.5)
nRBC: 0 % (ref 0.0–0.2)

## 2019-03-11 LAB — GLUCOSE, CAPILLARY
Glucose-Capillary: 149 mg/dL — ABNORMAL HIGH (ref 70–99)
Glucose-Capillary: 153 mg/dL — ABNORMAL HIGH (ref 70–99)
Glucose-Capillary: 174 mg/dL — ABNORMAL HIGH (ref 70–99)
Glucose-Capillary: 177 mg/dL — ABNORMAL HIGH (ref 70–99)

## 2019-03-11 LAB — MAGNESIUM: Magnesium: 2 mg/dL (ref 1.7–2.4)

## 2019-03-11 LAB — BETA-HYDROXYBUTYRIC ACID: Beta-Hydroxybutyric Acid: 3.16 mmol/L — ABNORMAL HIGH (ref 0.05–0.27)

## 2019-03-11 MED ORDER — AZITHROMYCIN 250 MG PO TABS: 250.0000 mg | ORAL_TABLET | Freq: Every day | ORAL | 0 refills | Status: AC

## 2019-03-11 MED ORDER — THIAMINE HCL 100 MG PO TABS: 100.0000 mg | ORAL_TABLET | Freq: Every day | ORAL | 0 refills | Status: AC

## 2019-03-11 MED ORDER — PHENOL 1.4 % MT LIQD: 1.0000 | OROMUCOSAL | 0 refills | Status: DC | PRN

## 2019-03-11 NOTE — Discharge Summary (Signed)
Physician Discharge Summary  Tommy Martinez YBO:[id number] DOB: 10/14/1980 DOA: 03/09/2019  PCP: Tommy Sites, MD  Admit date: 03/09/2019  Discharge date: 03/11/2019  Admitted From:Home  Disposition:  Home  Recommendations for Outpatient Follow-up:  1. Follow up with new PCP that will be arranged in 1-2 weeks 2. Patient is now agreeable to increase in insulin dosage or use of long-acting as he states that he gets his medications cheap from [place] and is not all that compliant to begin with 3. Follow-up to endocrinology with Tommy Martinez recommended.  Patient was previously told to go to this appointment, but then never followed up. 4. Encouraged alcohol and smoking cessation 5. Given azithromycin for several more days for pharyngitis as well as Chloraseptic spray  Home Health: None  Equipment/Devices: None  Discharge Condition: Stable  CODE STATUS: Full  Diet recommendation: Heart Healthy/carb modified  Brief/Interim Summary: Per HPI: JamesKingis a39 y.o.male,with a history of type one diabetes presents with nausea, vomiting, diarrhea, and abdominal pain that started at 3:00 am 03/09/19. Patient reports that he knew he was in DKA because he has had DKA before. He does not check his glucose at home, and reports that he "wings it." In an effort to get himself out of DKA, patient took 95units of short acting insulin throughout the day, but his nausea and vomiting continued, so he decided to come into the ED. Patient reports that his emesis was yellow at first, and then turned clear after he drank a bottle of water. Patient reports that there was no undigested food or blood present. He is not sure when his last normal meal was, but reports that it was sometime on the 12th of Feb. His diarrhea has not been bloody per his report, but he can offer no further details into color, consistency, etc. Patient notes associated epigastric pain that started after he had vomited several times. Patient  reports that it feels like heart burn, and it radiates into his throat. Patient reports a lot of pain in his throat - again there was not hematemesis. His last symptom to develop is a productive cough with clear sputum.   Patient's last DKA episode was after a night of heavy drinking. Patient does report that he drinks 0.5 - 1 fifth of EtOH every day. Last night, he reportedly drank "a lot more" than that. He can't quantify how much. Patient has never had a seizure 2/2 EtOH withdrawal, but has developed tremors 2/2 to EtOH withdrawal. His EtOH level at the time of admission was less than 10.  As for patient's DMI management - patient is unclear how much insulin he actually takes. He reports that he usually takes 15u long acting insulin in the AM and PM each day. He thinks he has a carb correction of 2:1. He is not sure about a post-prandial sliding scale correction. Again, patient is non compliant with his fingerstick glucose checks.   2/14: Patient has been admitted with DKA with associated AKI and transaminitis with history of alcohol use.  He was transitioned to subcutaneous insulin and diet has been initiated this morning.  Unfortunately, his blood glucose as well as beta hydroxybutyrate continues to trend up at the moment.  He will need careful observation as well as ongoing IV fluid.  He has not eaten very much either.  2/15: DKA has resolved and so has his associated AKI and transaminitis (see labs below.)  He is otherwise doing well and tolerating diet, but has a few limitations given his  sore throat.  He will continue on azithromycin as well as Chloraseptic spray as provided.  He has form medication compliance and dietary compliance at home and I have encouraged him to continue to monitor his blood glucose regularly and to take his insulin as prescribed and watch his diet.  He agrees to try and do this, but has poor follow-up.  New PCP will be arranged prior to discharge.  I have also discussed  alcohol cessation which he states that he will "try."  Unfortunately, he remains at high risk for readmission given his noncompliance and ongoing alcohol abuse.  Hopefully he will follow-up with a primary care provider as recommended.  Discharge Diagnoses:  Active Problems:   DKA (diabetic ketoacidoses) (HCC)  Principal discharge diagnosis: DKA in the setting of type 1 diabetes with associated AKI and transaminitis in the setting of alcohol abuse.  He also had upper respiratory infection with associated pharyngitis.  Discharge Instructions  Discharge Instructions    Diet - low sodium heart healthy   Complete by: As directed    Increase activity slowly   Complete by: As directed      Allergies as of 03/11/2019   No Known Allergies     Medication List    TAKE these medications   azithromycin 250 MG tablet Commonly known as: ZITHROMAX Take 1 tablet (250 mg total) by mouth daily for 3 doses.   insulin NPH Human 100 UNIT/ML injection Commonly known as: NOVOLIN N Inject 15 Units into the skin in the morning and at bedtime.   insulin regular 100 units/mL injection Commonly known as: NOVOLIN R Inject 5-10 Units into the skin 3 (three) times daily before meals. Per sliding scale   phenol 1.4 % Liqd Commonly known as: CHLORASEPTIC Use as directed 1 spray in the mouth or throat as needed for throat irritation / pain.   thiamine 100 MG tablet Take 1 tablet (100 mg total) by mouth daily. Start taking on: March 12, 2019      Follow-up Information    Tommy Found, MD Follow up.   Specialty: Family Medicine Contact information: [address] [place] [address] [phone]          No Known Allergies  Consultations:  None   Procedures/Studies: DG Chest Port 1 View  Result Date: 03/09/2019 CLINICAL DATA:  Shortness of breath and vomiting. EXAM: PORTABLE CHEST 1 VIEW COMPARISON:  July 29, 2018 FINDINGS: The heart size and mediastinal contours are within  normal limits. Both lungs are clear. The visualized skeletal structures are unremarkable. IMPRESSION: No active disease. Electronically Signed   By: Tommy Martinez M.D.   On: 03/09/2019 17:25     Discharge Exam: Vitals:   03/11/19 0244 03/11/19 0407  BP:  (!) 159/97  Pulse: 96 92  Resp:  18  Temp:  99.2 F (37.3 C)  SpO2:  100%   Vitals:   03/10/19 1407 03/10/19 2108 03/11/19 0244 03/11/19 0407  BP: 124/77 (!) 149/94  (!) 159/97  Pulse: (!) 102 (!) 107 96 92  Resp: 16 19  18   Temp: 98 F (36.7 C) 99.3 F (37.4 C)  99.2 F (37.3 C)  TempSrc: Oral Oral  Oral  SpO2: 99% 98%  100%  Weight:      Height:        General: Pt is alert, awake, not in acute distress Cardiovascular: RRR, S1/S2 +, no rubs, no gallops Respiratory: CTA bilaterally, no wheezing, no rhonchi Abdominal: Soft, NT, ND, bowel sounds +  Extremities: no edema, no cyanosis    The results of significant diagnostics from this hospitalization (including imaging, microbiology, ancillary and laboratory) are listed below for reference.     Microbiology: Recent Results (from the past 240 hour(s))  Respiratory Panel by RT PCR (Flu A&B, Covid) - Nasopharyngeal Swab     Status: None   Collection Time: 03/09/19  5:29 PM   Specimen: Nasopharyngeal Swab  Result Value Ref Range Status   SARS Coronavirus 2 by RT PCR NEGATIVE NEGATIVE Final    Comment: (NOTE) SARS-CoV-2 target nucleic acids are NOT DETECTED. The SARS-CoV-2 RNA is generally detectable in upper respiratoy specimens during the acute phase of infection. The lowest concentration of SARS-CoV-2 viral copies this assay can detect is 131 copies/mL. A negative result does not preclude SARS-Cov-2 infection and should not be used as the sole basis for treatment or other patient management decisions. A negative result may occur with  improper specimen collection/handling, submission of specimen other than nasopharyngeal swab, presence of viral mutation(s) within  the areas targeted by this assay, and inadequate number of viral copies (<131 copies/mL). A negative result must be combined with clinical observations, patient history, and epidemiological information. The expected result is Negative. Fact Sheet for Patients:  https://www.moore.com/ Fact Sheet for Healthcare Providers:  https://www.young.biz/ This test is not yet ap proved or cleared by the [place] FDA and  has been authorized for detection and/or diagnosis of SARS-CoV-2 by FDA under an Emergency Use Authorization (EUA). This EUA will remain  in effect (meaning this test can be used) for the duration of the COVID-19 declaration under Section 564(b)(1) of the Act, 21 U.S.C. section 360bbb-3(b)(1), unless the authorization is terminated or revoked sooner.    Influenza A by PCR NEGATIVE NEGATIVE Final   Influenza B by PCR NEGATIVE NEGATIVE Final    Comment: (NOTE) The Xpert Xpress SARS-CoV-2/FLU/RSV assay is intended as an aid in  the diagnosis of influenza from Nasopharyngeal swab specimens and  should not be used as a sole basis for treatment. Nasal washings and  aspirates are unacceptable for Xpert Xpress SARS-CoV-2/FLU/RSV  testing. Fact Sheet for Patients: https://www.moore.com/ Fact Sheet for Healthcare Providers: https://www.young.biz/ This test is not yet approved or cleared by the [place] FDA and  has been authorized for detection and/or diagnosis of SARS-CoV-2 by  FDA under an Emergency Use Authorization (EUA). This EUA will remain  in effect (meaning this test can be used) for the duration of the  Covid-19 declaration under Section 564(b)(1) of the Act, 21  U.S.C. section 360bbb-3(b)(1), unless the authorization is  terminated or revoked. Performed at [facility], [address] [place], [place] [address]   MRSA PCR Screening     Status: Abnormal   Collection Time: 03/09/19 10:41  PM   Specimen: Nasal Mucosa; Nasopharyngeal  Result Value Ref Range Status   MRSA by PCR POSITIVE (A) NEGATIVE Final    Comment:        The GeneXpert MRSA Assay (FDA approved for NASAL specimens only), is one component of a comprehensive MRSA colonization surveillance program. It is not intended to diagnose MRSA infection nor to guide or monitor treatment for MRSA infections. RESULT CALLED TO, READ BACK BY AND VERIFIED WITH: VILLALOBOS,S @ 0245 ON 03/10/19 BY JUW Performed at [facility], [address] [place], [place] [address]      Labs: BNP (last 3 results) No results for input(s): BNP in the last 8760 hours. Basic Metabolic Panel: Recent Labs  Lab 03/09/19  2138 03/10/19 0237 03/10/19 0918 03/10/19 1358 03/11/19 0607  NA 138 139 138 136 137  K 4.1 4.1 3.5 3.6 3.6  CL 98 100 100 99 99  CO2 27 26 26 22 25   GLUCOSE 85 121* 157* 218* 151*  BUN 14 12 11 10 7   CREATININE 0.99 0.88 0.82 0.98 0.81  CALCIUM 8.4* 8.2* 8.5* 8.8* 8.9  MG  --  1.2* 2.3  --  2.0  PHOS  --  2.2*  --   --   --    Liver Function Tests: Recent Labs  Lab 03/09/19 1729 03/10/19 0237 03/11/19 0607  AST 179* 121* 49*  ALT 68* 53* 37  ALKPHOS 153* 110 103  BILITOT 2.0* 0.9 1.4*  PROT 8.0 6.8 6.5  ALBUMIN 4.5 3.8 3.4*   Recent Labs  Lab 03/09/19 1729  LIPASE 31   No results for input(s): AMMONIA in the last 168 hours. CBC: Recent Labs  Lab 03/09/19 1729 03/10/19 0237 03/11/19 0607  WBC 12.2* 11.6* 7.4  NEUTROABS 11.1*  --   --   HGB 14.4 12.9* 12.3*  HCT 41.8 35.8* 35.5*  MCV 106.9* 102.0* 104.4*  PLT 218 174 106*   Cardiac Enzymes: No results for input(s): CKTOTAL, CKMB, CKMBINDEX, TROPONINI in the last 168 hours. BNP: Invalid input(s): POCBNP CBG: Recent Labs  Lab 03/10/19 2107 03/11/19 0020 03/11/19 0404 03/11/19 0801 03/11/19 0809  GLUCAP 229* 149* 177* 153* 174*   D-Dimer No results for input(s): DDIMER in the last 72 hours. Hgb A1c Recent Labs     03/10/19 0237  HGBA1C 8.7*   Lipid Profile No results for input(s): CHOL, HDL, LDLCALC, TRIG, CHOLHDL, LDLDIRECT in the last 72 hours. Thyroid function studies No results for input(s): TSH, T4TOTAL, T3FREE, THYROIDAB in the last 72 hours.  Invalid input(s): FREET3 Anemia work up No results for input(s): VITAMINB12, FOLATE, FERRITIN, TIBC, IRON, RETICCTPCT in the last 72 hours. Urinalysis    Component Value Date/Time   COLORURINE YELLOW 03/09/2019 1826   APPEARANCEUR HAZY (A) 03/09/2019 1826   LABSPEC 1.014 03/09/2019 1826   PHURINE 5.0 03/09/2019 1826   GLUCOSEU >=500 (A) 03/09/2019 1826   HGBUR MODERATE (A) 03/09/2019 1826   BILIRUBINUR NEGATIVE 03/09/2019 1826   KETONESUR 80 (A) 03/09/2019 1826   PROTEINUR 100 (A) 03/09/2019 1826   UROBILINOGEN 0.2 01/25/2009 0222   NITRITE NEGATIVE 03/09/2019 1826   LEUKOCYTESUR NEGATIVE 03/09/2019 1826   Sepsis Labs Invalid input(s): PROCALCITONIN,  WBC,  LACTICIDVEN Microbiology Recent Results (from the past 240 hour(s))  Respiratory Panel by RT PCR (Flu A&B, Covid) - Nasopharyngeal Swab     Status: None   Collection Time: 03/09/19  5:29 PM   Specimen: Nasopharyngeal Swab  Result Value Ref Range Status   SARS Coronavirus 2 by RT PCR NEGATIVE NEGATIVE Final    Comment: (NOTE) SARS-CoV-2 target nucleic acids are NOT DETECTED. The SARS-CoV-2 RNA is generally detectable in upper respiratoy specimens during the acute phase of infection. The lowest concentration of SARS-CoV-2 viral copies this assay can detect is 131 copies/mL. A negative result does not preclude SARS-Cov-2 infection and should not be used as the sole basis for treatment or other patient management decisions. A negative result may occur with  improper specimen collection/handling, submission of specimen other than nasopharyngeal swab, presence of viral mutation(s) within the areas targeted by this assay, and inadequate number of viral copies (<131 copies/mL). A negative  result must be combined with clinical observations, patient history, and epidemiological information. The expected  result is Negative. Fact Sheet for Patients:  https://www.moore.com/ Fact Sheet for Healthcare Providers:  https://www.young.biz/ This test is not yet ap proved or cleared by the [place] FDA and  has been authorized for detection and/or diagnosis of SARS-CoV-2 by FDA under an Emergency Use Authorization (EUA). This EUA will remain  in effect (meaning this test can be used) for the duration of the COVID-19 declaration under Section 564(b)(1) of the Act, 21 U.S.C. section 360bbb-3(b)(1), unless the authorization is terminated or revoked sooner.    Influenza A by PCR NEGATIVE NEGATIVE Final   Influenza B by PCR NEGATIVE NEGATIVE Final    Comment: (NOTE) The Xpert Xpress SARS-CoV-2/FLU/RSV assay is intended as an aid in  the diagnosis of influenza from Nasopharyngeal swab specimens and  should not be used as a sole basis for treatment. Nasal washings and  aspirates are unacceptable for Xpert Xpress SARS-CoV-2/FLU/RSV  testing. Fact Sheet for Patients: https://www.moore.com/ Fact Sheet for Healthcare Providers: https://www.young.biz/ This test is not yet approved or cleared by the [place] FDA and  has been authorized for detection and/or diagnosis of SARS-CoV-2 by  FDA under an Emergency Use Authorization (EUA). This EUA will remain  in effect (meaning this test can be used) for the duration of the  Covid-19 declaration under Section 564(b)(1) of the Act, 21  U.S.C. section 360bbb-3(b)(1), unless the authorization is  terminated or revoked. Performed at [facility], [address] [place], [place] [address]   MRSA PCR Screening     Status: Abnormal   Collection Time: 03/09/19 10:41 PM   Specimen: Nasal Mucosa; Nasopharyngeal  Result Value Ref Range Status   MRSA by PCR POSITIVE (A)  NEGATIVE Final    Comment:        The GeneXpert MRSA Assay (FDA approved for NASAL specimens only), is one component of a comprehensive MRSA colonization surveillance program. It is not intended to diagnose MRSA infection nor to guide or monitor treatment for MRSA infections. RESULT CALLED TO, READ BACK BY AND VERIFIED WITH: VILLALOBOS,S @ 0245 ON 03/10/19 BY JUW Performed at [facility], [address], [place], [place] [address]      Time coordinating discharge: 35 minutes  SIGNED:   Erick Blinks, DO [facility] 03/11/2019, 10:09 AM  If 7PM-7AM, please contact night-coverage [email or web address]

## 2019-03-11 NOTE — Care Management (Signed)
Writer left a voicemail message requesting a new patient appointment at [facility], [phone]

## 2019-03-11 NOTE — Progress Notes (Signed)
Inpatient Diabetes Program Recommendations  AACE/ADA: New Consensus Statement on Inpatient Glycemic Control   Target Ranges:  Prepandial:   less than 140 mg/dL      Peak postprandial:   less than 180 mg/dL (1-2 hours)      Critically ill patients:  140 - 180 mg/dL   Results for Tommy Martinez, Tommy Martinez (MRN [id number]) as of 03/11/2019 09:10  Ref. Range 03/11/2019 00:20 03/11/2019 04:04 03/11/2019 08:01 03/11/2019 08:09  Glucose-Capillary Latest Ref Range: 70 - 99 mg/dL 850 (H) 277 (H)  Novolog 3 units 153 (H) 174 (H)  Novolog 3 units  Results for Tommy Martinez, Tommy Martinez (MRN [id number]) as of 03/11/2019 09:10  Ref. Range 03/10/2019 07:16 03/10/2019 11:59 03/10/2019 16:13 03/10/2019 21:07  Glucose-Capillary Latest Ref Range: 70 - 99 mg/dL 720 (H)  Novolog 3 units 196 (H)  Novolog 3 units 237 (H)  Novolog 5 units 229 (H)  Novolog 5 units  Lantus 10 units  Results for Tommy Martinez, Tommy Martinez (MRN [id number]) as of 03/11/2019 09:10  Ref. Range 03/10/2019 02:37  Hemoglobin A1C Latest Ref Range: 4.8 - 5.6 % 8.7 (H)   Review of Glycemic Control  Diabetes history: DM1 (does not make any insulin; requires basal, correction, and carb coverage insulin) Outpatient Diabetes medications: NPH 15 units BID, Regular 5-10 units TID with meals Current orders for Inpatient glycemic control: Lantus 10 units QHS, Novolog 0-15 units Q4H  Inpatient Diabetes Program Recommendations:   Insulin - Basal: Please consider increasing Lantus to 15 units QHS.  Correction (SSI): Please consider changing Novolog correction to sensitive 0-9 units TID with meals and Novolog 0-5 units QHS for bedtime correction.  Insulin - Meal Coverage: Please consider ordering Novolog 3 units TID with meals for meal coverage if patient eats at least 50% of meals.  HgbA1C: A1C 8.7% on 03/10/19 indicating an average glucose of 203 mg/dl over the past 2-3 months.  Thanks, Orlando Penner, RN, MSN, CDE Diabetes Coordinator Inpatient Diabetes Program [phone] (Team Pager  from 8am to 5pm)

## 2019-03-11 NOTE — Plan of Care (Signed)
  Problem: Education: Goal: Knowledge of General Education information will improve Description: Including pain rating scale, medication(s)/side effects and non-pharmacologic comfort measures 03/11/2019 1020 by Carole Civil, RN Outcome: Adequate for Discharge 03/11/2019 1005 by Carole Civil, RN Outcome: Progressing   Problem: Health Behavior/Discharge Planning: Goal: Ability to manage health-related needs will improve 03/11/2019 1020 by Carole Civil, RN Outcome: Adequate for Discharge 03/11/2019 1005 by Carole Civil, RN Outcome: Progressing   Problem: Clinical Measurements: Goal: Ability to maintain clinical measurements within normal limits will improve Outcome: Adequate for Discharge Goal: Will remain free from infection Outcome: Adequate for Discharge Goal: Diagnostic test results will improve Outcome: Adequate for Discharge Goal: Respiratory complications will improve Outcome: Adequate for Discharge Goal: Cardiovascular complication will be avoided Outcome: Adequate for Discharge   Problem: Activity: Goal: Risk for activity intolerance will decrease 03/11/2019 1020 by Carole Civil, RN Outcome: Adequate for Discharge 03/11/2019 1005 by Carole Civil, RN Outcome: Progressing   Problem: Nutrition: Goal: Adequate nutrition will be maintained 03/11/2019 1020 by Carole Civil, RN Outcome: Adequate for Discharge 03/11/2019 1005 by Carole Civil, RN Outcome: Progressing   Problem: Coping: Goal: Level of anxiety will decrease 03/11/2019 1020 by Carole Civil, RN Outcome: Adequate for Discharge 03/11/2019 1005 by Carole Civil, RN Outcome: Progressing   Problem: Elimination: Goal: Will not experience complications related to bowel motility Outcome: Adequate for Discharge Goal: Will not experience complications related to urinary retention Outcome: Adequate for Discharge   Problem: Pain Managment: Goal: General experience of comfort will improve 03/11/2019 1020 by  Carole Civil, RN Outcome: Adequate for Discharge 03/11/2019 1005 by Carole Civil, RN Outcome: Progressing   Problem: Safety: Goal: Ability to remain free from injury will improve 03/11/2019 1020 by Carole Civil, RN Outcome: Adequate for Discharge 03/11/2019 1005 by Carole Civil, RN Outcome: Progressing   Problem: Skin Integrity: Goal: Risk for impaired skin integrity will decrease 03/11/2019 1020 by Carole Civil, RN Outcome: Adequate for Discharge 03/11/2019 1005 by Carole Civil, RN Outcome: Progressing

## 2019-03-11 NOTE — Progress Notes (Signed)
Nsg Discharge Note  Admit Date:  03/09/2019 Discharge date: 03/11/2019   Renelda Loma to be D/C'd home per MD order.  AVS completed.  Copy for chart, and copy for patient signed, and dated. Patient/caregiver able to verbalize understanding.  Discharge Medication: Allergies as of 03/11/2019   No Known Allergies     Medication List    TAKE these medications   azithromycin 250 MG tablet Commonly known as: ZITHROMAX Take 1 tablet (250 mg total) by mouth daily for 3 doses.   insulin NPH Human 100 UNIT/ML injection Commonly known as: NOVOLIN N Inject 15 Units into the skin in the morning and at bedtime.   insulin regular 100 units/mL injection Commonly known as: NOVOLIN R Inject 5-10 Units into the skin 3 (three) times daily before meals. Per sliding scale   phenol 1.4 % Liqd Commonly known as: CHLORASEPTIC Use as directed 1 spray in the mouth or throat as needed for throat irritation / pain.   thiamine 100 MG tablet Take 1 tablet (100 mg total) by mouth daily. Start taking on: March 12, 2019       Discharge Assessment: Vitals:   03/11/19 0244 03/11/19 0407  BP:  (!) 159/97  Pulse: 96 92  Resp:  18  Temp:  99.2 F (37.3 C)  SpO2:  100%   Skin clean, dry and intact without evidence of skin break down, no evidence of skin tears noted. IV catheter discontinued intact. Site without signs and symptoms of complications - no redness or edema noted at insertion site, patient denies c/o pain - only slight tenderness at site.  Dressing with slight pressure applied.  D/c Instructions-Education: Discharge instructions given to patient/family with verbalized understanding. D/c education completed with patient/family including follow up instructions, medication list, d/c activities limitations if indicated, with other d/c instructions as indicated by MD - patient able to verbalize understanding, all questions fully answered. Patient instructed to return to ED, call 911, or call MD for  any changes in condition.  Patient escorted via WC, and D/C home via private auto.  Carole Civil, RN 03/11/2019 10:21 AM

## 2019-03-11 NOTE — Plan of Care (Signed)

## 2020-01-21 ENCOUNTER — Emergency Department (HOSPITAL_COMMUNITY)
Admission: EM | Admit: 2020-01-21 | Discharge: 2020-01-21 | Disposition: A | Discharge status: Home / Self Care | Payer: Managed Care, Other (non HMO) | Attending: Emergency Medicine | Admitting: Emergency Medicine

## 2020-01-21 ENCOUNTER — Emergency Department (HOSPITAL_COMMUNITY): Payer: Managed Care, Other (non HMO)

## 2020-01-21 ENCOUNTER — Other Ambulatory Visit: Payer: Self-pay

## 2020-01-21 ENCOUNTER — Encounter (HOSPITAL_COMMUNITY): Payer: Self-pay

## 2020-01-21 DIAGNOSIS — R079 Chest pain, unspecified: Secondary | ICD-10-CM | POA: Insufficient documentation

## 2020-01-21 DIAGNOSIS — S82899A Other fracture of unspecified lower leg, initial encounter for closed fracture: Secondary | ICD-10-CM

## 2020-01-21 DIAGNOSIS — S93314A Dislocation of tarsal joint of right foot, initial encounter: Secondary | ICD-10-CM | POA: Insufficient documentation

## 2020-01-21 DIAGNOSIS — F17200 Nicotine dependence, unspecified, uncomplicated: Secondary | ICD-10-CM | POA: Diagnosis not present

## 2020-01-21 DIAGNOSIS — Z20822 Contact with and (suspected) exposure to covid-19: Secondary | ICD-10-CM | POA: Insufficient documentation

## 2020-01-21 DIAGNOSIS — S99911A Unspecified injury of right ankle, initial encounter: Secondary | ICD-10-CM | POA: Diagnosis present

## 2020-01-21 DIAGNOSIS — E109 Type 1 diabetes mellitus without complications: Secondary | ICD-10-CM | POA: Diagnosis not present

## 2020-01-21 DIAGNOSIS — Z794 Long term (current) use of insulin: Secondary | ICD-10-CM | POA: Diagnosis not present

## 2020-01-21 DIAGNOSIS — Y92410 Unspecified street and highway as the place of occurrence of the external cause: Secondary | ICD-10-CM | POA: Diagnosis not present

## 2020-01-21 DIAGNOSIS — S92909A Unspecified fracture of unspecified foot, initial encounter for closed fracture: Secondary | ICD-10-CM

## 2020-01-21 DIAGNOSIS — S92811A Other fracture of right foot, initial encounter for closed fracture: Secondary | ICD-10-CM | POA: Insufficient documentation

## 2020-01-21 LAB — CBC WITH DIFFERENTIAL/PLATELET
Abs Immature Granulocytes: 0.04 10*3/uL (ref 0.00–0.07)
Basophils Absolute: 0.1 10*3/uL (ref 0.0–0.1)
Basophils Relative: 1 %
Eosinophils Absolute: 0.1 10*3/uL (ref 0.0–0.5)
Eosinophils Relative: 1 %
HCT: 43.4 % (ref 39.0–52.0)
Hemoglobin: 15.2 g/dL (ref 13.0–17.0)
Immature Granulocytes: 1 %
Lymphocytes Relative: 23 %
Lymphs Abs: 1.6 10*3/uL (ref 0.7–4.0)
MCH: 36.4 pg — ABNORMAL HIGH (ref 26.0–34.0)
MCHC: 35 g/dL (ref 30.0–36.0)
MCV: 103.8 fL — ABNORMAL HIGH (ref 80.0–100.0)
Monocytes Absolute: 0.6 10*3/uL (ref 0.1–1.0)
Monocytes Relative: 10 %
Neutro Abs: 4.4 10*3/uL (ref 1.7–7.7)
Neutrophils Relative %: 64 %
Platelets: 231 10*3/uL (ref 150–400)
RBC: 4.18 MIL/uL — ABNORMAL LOW (ref 4.22–5.81)
RDW: 12.1 % (ref 11.5–15.5)
WBC: 6.8 10*3/uL (ref 4.0–10.5)
nRBC: 0 % (ref 0.0–0.2)

## 2020-01-21 LAB — BASIC METABOLIC PANEL
Anion gap: 18 — ABNORMAL HIGH (ref 5–15)
BUN: 6 mg/dL (ref 6–20)
CO2: 26 mmol/L (ref 22–32)
Calcium: 9 mg/dL (ref 8.9–10.3)
Chloride: 98 mmol/L (ref 98–111)
Creatinine, Ser: 0.81 mg/dL (ref 0.61–1.24)
GFR, Estimated: >60 mL/min (ref >60)
Glucose, Bld: 100 mg/dL — ABNORMAL HIGH (ref 70–99)
Potassium: 3.4 mmol/L — ABNORMAL LOW (ref 3.5–5.1)
Sodium: 142 mmol/L (ref 135–145)

## 2020-01-21 LAB — RESP PANEL BY RT-PCR (FLU A&B, COVID) ARPGX2
Influenza A by PCR: NEGATIVE
Influenza B by PCR: NEGATIVE
SARS Coronavirus 2 by RT PCR: NEGATIVE

## 2020-01-21 MED ORDER — PROPOFOL 10 MG/ML IV BOLUS
INTRAVENOUS | Status: AC | PRN
Start: 2020-01-21 — End: 2020-01-21
  Administered 2020-01-21: 100 mg via INTRAVENOUS

## 2020-01-21 MED ORDER — HYDROMORPHONE HCL 1 MG/ML IJ SOLN
1.0000 mg | Freq: Once | INTRAMUSCULAR | Status: AC
  Administered 2020-01-21: 23:00:00 1 mg via INTRAVENOUS
  Filled 2020-01-21: qty 1

## 2020-01-21 MED ORDER — FENTANYL CITRATE (PF) 100 MCG/2ML IJ SOLN
100.0000 ug | Freq: Once | INTRAMUSCULAR | Status: AC
  Administered 2020-01-21: 100 ug via INTRAVENOUS
  Filled 2020-01-21: qty 2

## 2020-01-21 MED ORDER — ONDANSETRON HCL 4 MG/2ML IJ SOLN
4.0000 mg | Freq: Once | INTRAMUSCULAR | Status: AC
  Administered 2020-01-21: 19:00:00 4 mg via INTRAVENOUS
  Filled 2020-01-21: qty 2

## 2020-01-21 MED ORDER — PROPOFOL 10 MG/ML IV BOLUS
60.0000 mg | Freq: Once | INTRAVENOUS | Status: DC
  Filled 2020-01-21: qty 20

## 2020-01-21 MED ORDER — OXYCODONE HCL 5 MG PO TABS: 5.0000 mg | ORAL_TABLET | ORAL | 0 refills | Status: DC | PRN

## 2020-01-21 MED ORDER — LIDOCAINE-EPINEPHRINE 1 %-1:100000 IJ SOLN
10.0000 mL | Freq: Once | INTRAMUSCULAR | Status: AC
  Administered 2020-01-21: 10 mL via INTRADERMAL
  Filled 2020-01-21: qty 1

## 2020-01-21 MED ORDER — HYDROMORPHONE HCL 1 MG/ML IJ SOLN
1.0000 mg | Freq: Once | INTRAMUSCULAR | Status: AC
Start: 2020-01-21 — End: 2020-01-21
  Administered 2020-01-21: 19:00:00 1 mg via INTRAVENOUS
  Filled 2020-01-21: qty 1

## 2020-01-21 NOTE — ED Notes (Signed)
Lidocaine at bedside.

## 2020-01-21 NOTE — ED Notes (Signed)
E-signature pad unavailable at time of pt discharge. This RN discussed discharge materials with pt and answered all pt questions. Pt stated understanding of discharge material. ? ?
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
## 2020-01-21 NOTE — Progress Notes (Signed)
Orthopedic Tech Progress Note Patient Details:  Tommy Martinez Apr 21, 1980 [id number]  Ortho Devices Type of Ortho Device: Post (short leg) splint,Stirrup splint Splint Material: Plaster Ortho Device/Splint Location: Right Lower Extremity Ortho Device/Splint Interventions: Ordered,Application   Post Interventions Patient Tolerated: Well Instructions Provided: Poper ambulation with device,Care of device   Gerald Stabs 01/21/2020, 9:03 PM
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
## 2020-01-21 NOTE — Discharge Instructions (Addendum)
Do not put any weight on your right foot and ankle. Expect a lot of foot and ankle swelling -ice and elevation as needed. Keep your splint clean and dry.

## 2020-01-21 NOTE — ED Notes (Signed)
Pt endorses etoh used today, unable to tell me how much; states she drinks 1/5 liquor per day; denies drug use

## 2020-01-23 ENCOUNTER — Telehealth: Payer: Self-pay

## 2020-01-23 NOTE — Telephone Encounter (Signed)
Looks like appt was moved up to Tuesday with dr. Magnus Ivan

## 2020-01-23 NOTE — Telephone Encounter (Signed)
I did see this gentleman in the emergency room Tuesday evening and he had a very bad dislocation of his foot that I reduced and put him in a splint.  He definitely needs to be seen early next week.  Even if Dr. Lajoyce Corners can see him I would be okay.  Even Bronson Curb can see him.  Regardless, he needs to be seen next week.

## 2020-01-23 NOTE — Telephone Encounter (Signed)
Patients sister called she stated patient was seen in the ed 12/28, discharge summary says to follow up in 6 days. Patient is requesting sooner appt. If possible he is scheduled for 1/12.CB:[phone]

## 2020-01-28 ENCOUNTER — Ambulatory Visit (INDEPENDENT_AMBULATORY_CARE_PROVIDER_SITE_OTHER): Payer: Managed Care, Other (non HMO) | Admitting: Orthopaedic Surgery

## 2020-01-28 ENCOUNTER — Ambulatory Visit (INDEPENDENT_AMBULATORY_CARE_PROVIDER_SITE_OTHER): Payer: Managed Care, Other (non HMO)

## 2020-01-28 ENCOUNTER — Encounter: Payer: Self-pay | Admitting: Orthopaedic Surgery

## 2020-01-28 DIAGNOSIS — S93314D Dislocation of tarsal joint of right foot, subsequent encounter: Secondary | ICD-10-CM | POA: Diagnosis not present

## 2020-01-28 MED ORDER — OXYCODONE HCL 5 MG PO TABS: 5.0000 mg | ORAL_TABLET | ORAL | 0 refills | Status: DC | PRN
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
## 2020-01-28 NOTE — Addendum Note (Signed)
Addended byPrescott Parma on: 01/28/2020 10:08 AM   Modules accepted: Orders

## 2020-02-05 ENCOUNTER — Ambulatory Visit: Payer: Managed Care, Other (non HMO) | Admitting: Physician Assistant

## 2020-02-06 ENCOUNTER — Telehealth: Payer: Self-pay | Admitting: Orthopaedic Surgery

## 2020-02-06 ENCOUNTER — Other Ambulatory Visit: Payer: Self-pay | Admitting: Orthopaedic Surgery

## 2020-02-06 MED ORDER — HYDROCODONE-ACETAMINOPHEN 5-325 MG PO TABS: 1.0000 | ORAL_TABLET | Freq: Four times a day (QID) | ORAL | 0 refills | Status: DC | PRN

## 2020-02-06 NOTE — Telephone Encounter (Signed)
Pt sister called and would like to know if you could refill him on hydrocodone he doesn't take more than 3 he takes one when he gets up and one when he goes to bed and one in the middle of the night if needed. Call back number [phone].

## 2020-02-10 ENCOUNTER — Ambulatory Visit: Payer: Managed Care, Other (non HMO) | Admitting: Orthopaedic Surgery

## 2020-02-12 ENCOUNTER — Other Ambulatory Visit: Payer: Self-pay

## 2020-02-12 ENCOUNTER — Ambulatory Visit (INDEPENDENT_AMBULATORY_CARE_PROVIDER_SITE_OTHER): Payer: Managed Care, Other (non HMO) | Admitting: Orthopaedic Surgery

## 2020-02-12 ENCOUNTER — Encounter: Payer: Self-pay | Admitting: Orthopaedic Surgery

## 2020-02-12 ENCOUNTER — Ambulatory Visit (INDEPENDENT_AMBULATORY_CARE_PROVIDER_SITE_OTHER): Payer: Managed Care, Other (non HMO)

## 2020-02-12 DIAGNOSIS — S93314D Dislocation of tarsal joint of right foot, subsequent encounter: Secondary | ICD-10-CM

## 2020-02-12 NOTE — Progress Notes (Signed)
Office Visit Note   Patient: Tommy Martinez           Date of Birth: 08-21-1980           MRN: [id number] Visit Date: 02/12/2020              Requested by: Assunta Found, MD [address] [place],  [place] [address] PCP: Assunta Found, MD   Assessment & Plan: Visit Diagnoses:  1. Closed traumatic dislocation of right subtalar joint, subsequent encounter     Plan: He will start weightbearing as tolerated in the cam walker  in 1 week.  Elevation wiggling toes encouraged.  Discussed with him smoking cessation and getting better control of his diabetes.  His sisters who is present today states that she plans on taking him to a new endocrinologist.  We will see him back in 4 weeks at that time we will obtain 3 views of his right foot.  In regards to the maceration between the toes he will wash his foot with Dial soap daily dry completely and then apply cotton balls between the webspaces.  Follow-Up Instructions: Return in about 4 weeks (around 03/11/2020) for Radiographs.   Orders:  Orders Placed This Encounter  Procedures  . XR Foot Complete Right   No orders of the defined types were placed in this encounter.     Procedures: No procedures performed   Clinical Data: No additional findings.   Subjective: Chief Complaint  Patient presents with  . Right Foot - Follow-up    HPI Owen 40 year old male comes in today with his sister for follow-up of his right subtalar dislocation at this location talonavicular joint with 1/5 metatarsal base fracture.  He is now approximately 3 weeks status post injury.  Ambulating with crutches in the cam walker boot nonweightbearing.  He has been putting some weight through his heel for balance.  He is diabetic also smokes.  He notes that he is having some skin breakdown between his toes. Review of Systems See HPI otherwise negative  Objective: Vital Signs: There were no vitals taken for this visit.  Physical Exam General: Well-developed  well-nourished male no acute distress mood affect appropriate Ortho Exam Right foot dorsal pedal pulses intact.  He has tenderness at the base of the fifth metatarsal posterior aspect of the talus.  He is able to wiggle his toes.  He has maceration between the second third and fourth webspaces.  There is no malodor no drainage no gross signs of infection.  Otherwise the blister over the posterior lateral aspect of the dorsal foot is healing well. Specialty Comments:  No specialty comments available.  Imaging: XR Foot Complete Right  Result Date: 02/12/2020 Right foot: Base of fifth metatarsal fracture with early signs of consolidation.  Posterior talus fracture with early signs of healing.  Calcaneus fracture is some sclerotic changes present in the area of the fracture seen on CT but unable to make out the fracture site on plain radiograph today.  Otherwise no acute fractures or subluxations dislocations.    PMFS History: Patient Active Problem List   Diagnosis Date Noted  . 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 DKA (diabetic ketoacidoses) 03/09/2019   Past Medical History:  Diagnosis Date  . Type 1 diabetes (HCC)     History reviewed. No pertinent family history.  History reviewed. No pertinent surgical history. Social History   Occupational History  . Not on file  Tobacco Use  . Smoking status: Current  Every Day Smoker  . Smokeless tobacco: Never Used  Substance and Sexual Activity  . Alcohol use: Yes    Comment: 1/5 liquor/day  . Drug use: Not Currently  . Sexual activity: Not on file

## 2020-08-15 ENCOUNTER — Other Ambulatory Visit: Payer: Self-pay

## 2020-08-15 ENCOUNTER — Inpatient Hospital Stay (HOSPITAL_COMMUNITY)
Admission: EM | Admit: 2020-08-15 | Discharge: 2020-08-18 | DRG: 638 | Disposition: A | Discharge status: Other Acute Inpatient Hospital | Payer: Managed Care, Other (non HMO) | Attending: Internal Medicine | Admitting: Internal Medicine

## 2020-08-15 ENCOUNTER — Emergency Department (HOSPITAL_COMMUNITY): Payer: Managed Care, Other (non HMO)

## 2020-08-15 ENCOUNTER — Encounter (HOSPITAL_COMMUNITY): Payer: Self-pay | Admitting: *Deleted

## 2020-08-15 DIAGNOSIS — R0602 Shortness of breath: Secondary | ICD-10-CM

## 2020-08-15 DIAGNOSIS — R9431 Abnormal electrocardiogram [ECG] [EKG]: Secondary | ICD-10-CM | POA: Diagnosis present

## 2020-08-15 DIAGNOSIS — R112 Nausea with vomiting, unspecified: Secondary | ICD-10-CM

## 2020-08-15 DIAGNOSIS — F1010 Alcohol abuse, uncomplicated: Secondary | ICD-10-CM | POA: Diagnosis not present

## 2020-08-15 DIAGNOSIS — F1910 Other psychoactive substance abuse, uncomplicated: Secondary | ICD-10-CM | POA: Diagnosis not present

## 2020-08-15 DIAGNOSIS — R748 Abnormal levels of other serum enzymes: Secondary | ICD-10-CM | POA: Diagnosis not present

## 2020-08-15 DIAGNOSIS — R45851 Suicidal ideations: Secondary | ICD-10-CM | POA: Diagnosis present

## 2020-08-15 DIAGNOSIS — E871 Hypo-osmolality and hyponatremia: Secondary | ICD-10-CM | POA: Diagnosis present

## 2020-08-15 DIAGNOSIS — R17 Unspecified jaundice: Secondary | ICD-10-CM | POA: Diagnosis present

## 2020-08-15 DIAGNOSIS — Z91128 Patient's intentional underdosing of medication regimen for other reason: Secondary | ICD-10-CM

## 2020-08-15 DIAGNOSIS — E1110 Type 2 diabetes mellitus with ketoacidosis without coma: Secondary | ICD-10-CM | POA: Diagnosis present

## 2020-08-15 DIAGNOSIS — E1010 Type 1 diabetes mellitus with ketoacidosis without coma: Secondary | ICD-10-CM | POA: Diagnosis not present

## 2020-08-15 DIAGNOSIS — R7401 Elevation of levels of liver transaminase levels: Secondary | ICD-10-CM | POA: Diagnosis present

## 2020-08-15 DIAGNOSIS — Z20822 Contact with and (suspected) exposure to covid-19: Secondary | ICD-10-CM | POA: Diagnosis present

## 2020-08-15 DIAGNOSIS — E860 Dehydration: Secondary | ICD-10-CM | POA: Diagnosis present

## 2020-08-15 DIAGNOSIS — Z9114 Patient's other noncompliance with medication regimen: Secondary | ICD-10-CM

## 2020-08-15 DIAGNOSIS — T383X6A Underdosing of insulin and oral hypoglycemic [antidiabetic] drugs, initial encounter: Secondary | ICD-10-CM | POA: Diagnosis present

## 2020-08-15 DIAGNOSIS — D72829 Elevated white blood cell count, unspecified: Secondary | ICD-10-CM | POA: Diagnosis present

## 2020-08-15 DIAGNOSIS — R718 Other abnormality of red blood cells: Secondary | ICD-10-CM

## 2020-08-15 DIAGNOSIS — K219 Gastro-esophageal reflux disease without esophagitis: Secondary | ICD-10-CM | POA: Diagnosis present

## 2020-08-15 DIAGNOSIS — F17210 Nicotine dependence, cigarettes, uncomplicated: Secondary | ICD-10-CM | POA: Diagnosis present

## 2020-08-15 LAB — BLOOD GAS, VENOUS
Acid-base deficit: 17.5 mmol/L — ABNORMAL HIGH (ref 0.0–2.0)
Bicarbonate: 10.8 mmol/L — ABNORMAL LOW (ref 20.0–28.0)
FIO2: 21
O2 Saturation: 32.4 %
Patient temperature: 36.5
pCO2, Ven: 23.4 mmHg — ABNORMAL LOW (ref 44.0–60.0)
pH, Ven: 7.208 — ABNORMAL LOW (ref 7.250–7.430)
pO2, Ven: <31 mmHg — CL (ref 32.0–45.0)

## 2020-08-15 LAB — LIPASE, BLOOD: Lipase: 92 U/L — ABNORMAL HIGH (ref 11–51)

## 2020-08-15 LAB — CBC WITH DIFFERENTIAL/PLATELET
Abs Immature Granulocytes: 0.11 10*3/uL — ABNORMAL HIGH (ref 0.00–0.07)
Basophils Absolute: 0.1 10*3/uL (ref 0.0–0.1)
Basophils Relative: 1 %
Eosinophils Absolute: 0 10*3/uL (ref 0.0–0.5)
Eosinophils Relative: 0 %
HCT: 41.3 % (ref 39.0–52.0)
Hemoglobin: 14.2 g/dL (ref 13.0–17.0)
Immature Granulocytes: 1 %
Lymphocytes Relative: 6 %
Lymphs Abs: 0.9 10*3/uL (ref 0.7–4.0)
MCH: 36.6 pg — ABNORMAL HIGH (ref 26.0–34.0)
MCHC: 34.4 g/dL (ref 30.0–36.0)
MCV: 106.4 fL — ABNORMAL HIGH (ref 80.0–100.0)
Monocytes Absolute: 1.3 10*3/uL — ABNORMAL HIGH (ref 0.1–1.0)
Monocytes Relative: 9 %
Neutro Abs: 12.1 10*3/uL — ABNORMAL HIGH (ref 1.7–7.7)
Neutrophils Relative %: 83 %
Platelets: 238 10*3/uL (ref 150–400)
RBC: 3.88 MIL/uL — ABNORMAL LOW (ref 4.22–5.81)
RDW: 14.6 % (ref 11.5–15.5)
WBC: 14.4 10*3/uL — ABNORMAL HIGH (ref 4.0–10.5)
nRBC: 0 % (ref 0.0–0.2)

## 2020-08-15 LAB — BASIC METABOLIC PANEL
Anion gap: 19 — ABNORMAL HIGH (ref 5–15)
BUN: 12 mg/dL (ref 6–20)
CO2: 13 mmol/L — ABNORMAL LOW (ref 22–32)
Calcium: 8.2 mg/dL — ABNORMAL LOW (ref 8.9–10.3)
Chloride: 99 mmol/L (ref 98–111)
Creatinine, Ser: 1.03 mg/dL (ref 0.61–1.24)
GFR, Estimated: >60 mL/min (ref >60)
Glucose, Bld: 126 mg/dL — ABNORMAL HIGH (ref 70–99)
Potassium: 4.4 mmol/L (ref 3.5–5.1)
Sodium: 131 mmol/L — ABNORMAL LOW (ref 135–145)

## 2020-08-15 LAB — COMPREHENSIVE METABOLIC PANEL
ALT: 50 U/L — ABNORMAL HIGH (ref 0–44)
AST: 30 U/L (ref 15–41)
Albumin: 4.5 g/dL (ref 3.5–5.0)
Alkaline Phosphatase: 135 U/L — ABNORMAL HIGH (ref 38–126)
Anion gap: 25 — ABNORMAL HIGH (ref 5–15)
BUN: 14 mg/dL (ref 6–20)
CO2: 9 mmol/L — ABNORMAL LOW (ref 22–32)
Calcium: 8.9 mg/dL (ref 8.9–10.3)
Chloride: 96 mmol/L — ABNORMAL LOW (ref 98–111)
Creatinine, Ser: 1.14 mg/dL (ref 0.61–1.24)
GFR, Estimated: >60 mL/min (ref >60)
Glucose, Bld: 97 mg/dL (ref 70–99)
Potassium: 4.8 mmol/L (ref 3.5–5.1)
Sodium: 130 mmol/L — ABNORMAL LOW (ref 135–145)
Total Bilirubin: 2.2 mg/dL — ABNORMAL HIGH (ref 0.3–1.2)
Total Protein: 8 g/dL (ref 6.5–8.1)

## 2020-08-15 LAB — ETHANOL: Alcohol, Ethyl (B): <10 mg/dL (ref <10)

## 2020-08-15 LAB — CBG MONITORING, ED
Glucose-Capillary: 94 mg/dL (ref 70–99)
Glucose-Capillary: 112 mg/dL — ABNORMAL HIGH (ref 70–99)
Glucose-Capillary: 113 mg/dL — ABNORMAL HIGH (ref 70–99)
Glucose-Capillary: 114 mg/dL — ABNORMAL HIGH (ref 70–99)
Glucose-Capillary: 143 mg/dL — ABNORMAL HIGH (ref 70–99)

## 2020-08-15 LAB — RESP PANEL BY RT-PCR (FLU A&B, COVID) ARPGX2
Influenza A by PCR: NEGATIVE
Influenza B by PCR: NEGATIVE
SARS Coronavirus 2 by RT PCR: NEGATIVE

## 2020-08-15 LAB — URINALYSIS, ROUTINE W REFLEX MICROSCOPIC
Bacteria, UA: NONE SEEN
Bilirubin Urine: NEGATIVE
Glucose, UA: 150 mg/dL — AB
Ketones, ur: 80 mg/dL — AB
Leukocytes,Ua: NEGATIVE
Nitrite: NEGATIVE
Protein, ur: 100 mg/dL — AB
Specific Gravity, Urine: 1.013 (ref 1.005–1.030)
pH: 6 (ref 5.0–8.0)

## 2020-08-15 LAB — RAPID URINE DRUG SCREEN, HOSP PERFORMED
Amphetamines: NOT DETECTED
Barbiturates: NOT DETECTED
Benzodiazepines: NOT DETECTED
Cocaine: NOT DETECTED
Opiates: NOT DETECTED
Tetrahydrocannabinol: POSITIVE — AB

## 2020-08-15 LAB — GLUCOSE, CAPILLARY
Glucose-Capillary: 143 mg/dL — ABNORMAL HIGH (ref 70–99)
Glucose-Capillary: 134 mg/dL — ABNORMAL HIGH (ref 70–99)
Glucose-Capillary: 156 mg/dL — ABNORMAL HIGH (ref 70–99)

## 2020-08-15 LAB — BETA-HYDROXYBUTYRIC ACID: Beta-Hydroxybutyric Acid: >8 mmol/L — ABNORMAL HIGH (ref 0.05–0.27)

## 2020-08-15 LAB — TROPONIN I (HIGH SENSITIVITY): Troponin I (High Sensitivity): 3 ng/L (ref <18)

## 2020-08-15 MED ORDER — SODIUM CHLORIDE 0.9 % IV BOLUS
1000.0000 mL | Freq: Once | INTRAVENOUS | Status: AC
  Administered 2020-08-15: 1000 mL via INTRAVENOUS

## 2020-08-15 MED ORDER — ACETAMINOPHEN 325 MG PO TABS
650.0000 mg | ORAL_TABLET | Freq: Once | ORAL | Status: AC
  Administered 2020-08-15: 650 mg via ORAL
  Filled 2020-08-15: qty 2

## 2020-08-15 MED ORDER — ONDANSETRON HCL 4 MG/2ML IJ SOLN
4.0000 mg | Freq: Four times a day (QID) | INTRAMUSCULAR | Status: DC | PRN
  Administered 2020-08-15: 4 mg via INTRAVENOUS
  Filled 2020-08-15: qty 2

## 2020-08-15 MED ORDER — DEXTROSE 50 % IV SOLN: 0.0000 mL | INTRAVENOUS | Status: DC | PRN

## 2020-08-15 MED ORDER — ALUM & MAG HYDROXIDE-SIMETH 200-200-20 MG/5ML PO SUSP
15.0000 mL | ORAL | Status: DC | PRN
  Administered 2020-08-15: 15 mL via ORAL
  Filled 2020-08-15: qty 30

## 2020-08-15 MED ORDER — POTASSIUM CHLORIDE 10 MEQ/100ML IV SOLN
10.0000 meq | INTRAVENOUS | Status: AC
  Administered 2020-08-15 (×2): 10 meq via INTRAVENOUS
  Filled 2020-08-15 (×2): qty 100

## 2020-08-15 MED ORDER — CHLORHEXIDINE GLUCONATE CLOTH 2 % EX PADS
6.0000 | MEDICATED_PAD | Freq: Every day | CUTANEOUS | Status: DC
  Administered 2020-08-17: 6 via TOPICAL

## 2020-08-15 MED ORDER — LACTATED RINGERS IV BOLUS
20.0000 mL/kg | Freq: Once | INTRAVENOUS | Status: AC
  Administered 2020-08-15: 1360 mL via INTRAVENOUS

## 2020-08-15 MED ORDER — LACTATED RINGERS IV SOLN: INTRAVENOUS | Status: DC

## 2020-08-15 MED ORDER — ONDANSETRON HCL 4 MG/2ML IJ SOLN
4.0000 mg | Freq: Once | INTRAMUSCULAR | Status: AC
  Administered 2020-08-15: 4 mg via INTRAVENOUS
  Filled 2020-08-15: qty 2

## 2020-08-15 MED ORDER — DEXTROSE IN LACTATED RINGERS 5 % IV SOLN: INTRAVENOUS | Status: DC

## 2020-08-15 MED ORDER — ENOXAPARIN SODIUM 40 MG/0.4ML IJ SOSY
40.0000 mg | PREFILLED_SYRINGE | INTRAMUSCULAR | Status: DC
  Administered 2020-08-15 – 2020-08-16 (×2): 40 mg via SUBCUTANEOUS
  Filled 2020-08-15 (×3): qty 0.4

## 2020-08-15 MED ORDER — INSULIN REGULAR(HUMAN) IN NACL 100-0.9 UT/100ML-% IV SOLN
INTRAVENOUS | Status: DC
Start: 2020-08-15 — End: 2020-08-16
  Administered 2020-08-15: 1.1 [IU]/h via INTRAVENOUS
  Filled 2020-08-15: qty 100

## 2020-08-15 NOTE — ED Notes (Signed)
Pt given urinal and informed of need for urine specimen  

## 2020-08-15 NOTE — ED Notes (Signed)
Date and time results received: 08/15/20 1635 (use smartphrase ".now" to insert current time)  Test: VBG PO2 Critical Value: <31.0  Name of Provider Notified: Uvaldo Rising, [place]  Orders Received? Or Actions Taken?:

## 2020-08-15 NOTE — H&P (Signed)
History and Physical  Tommy Martinez DJT:[id number] DOB: 1980/01/29 DOA: 08/15/2020  Referring physician: Carroll Sage, PA-C PCP: Assunta Found, MD  Patient coming from: Home  Chief Complaint: Shortness of breath and abdominal pain  HPI: Tommy Martinez is a 40 y.o. male with medical history significant for type I DM, noncompliance with medication regimen, alcohol abuse, tobacco and recreational drug use who presents to the emergency department due to 2-day onset of abdominal pain associated with nausea and nonbloody vomiting with difficulty in being able to tolerate any oral intake.  Patient complains of nonradiating, persistent sharp epigastric pain of moderate intensity with no known alleviating/aggravating factor.  Patient states that he has not been very compliant with his medication, he states that he took his insulin this morning, though he did not take it yesterday.  He complains of thirst, excessive urination with no burning sensation or any other irritative bladder symptoms on urination.  Patient states that he drinks 1/5 of hard liquor and denies withdrawal symptoms or seizures due to alcohol withdrawal. he states that he has not drank any alcohol since past 2 days.  He states that he smokes 1 pack of cigarettes daily for many years and endorsed use of marijuana occasionally.  Patient complained of shortness of breath on exertion which started few hours PTA.  He denies nausea, vomiting, headache, chest pain or diarrhea. Recent hospitalization: 2/13-2/15 due to DKA  ED Course:  In the emergency department, he was initially intermittently tachypneic, but this eventually normalized, other vital signs were within normal range.  Work-up in the ED showed leukocytosis, MCV 106.4, hyponatremia, bicarb 9, anion gap 25, T bili 2.2 and transaminitis.  Troponin x1- 3.  Lipase 92 urinalysis was positive for ketonuria.  Influenza A, B and SARS coronavirus 2 was negative. Chest x-ray showed no active  disease. Tylenol was given, and patient was started on Endo tool for DKA. Cardiology at [facility] was consulted due to abnormal ECG, and ECG changes was said to be related to patient's electrolyte abnormality with no concern for STEMI by ED PA.  Hospitalist was asked to admit patient for further evaluation and management.  Review of Systems: Constitutional: Negative for chills and fever.  HENT: Negative for ear pain and sore throat.   Eyes: Negative for pain and visual disturbance.  Respiratory: Positive for shortness of breath on arrival to the ED.  Negative for cough, chest tightness  Cardiovascular: Negative for chest pain and palpitations.  Gastrointestinal: Positive for abdominal pain, nausea and vomiting.  Endocrine: Negative for polyphagia and polyuria.  Genitourinary: Negative for decreased urine volume, dysuria, enuresis Musculoskeletal: Negative for arthralgias and back pain.  Skin: Negative for color change and rash.  Allergic/Immunologic: Negative for immunocompromised state.  Neurological: Positive for headache.  Negative for tremors, syncope, speech difficulty Hematological: Does not bruise/bleed easily.  All other systems reviewed and are negative   Past Medical History:  Diagnosis Date   Type 1 diabetes (HCC)    History reviewed. No pertinent surgical history.  Social History:  reports that he has been smoking. He has never used smokeless tobacco. He reports current alcohol use. He reports previous drug use.   No Known Allergies  History reviewed. No pertinent family history.   Prior to Admission medications   Medication Sig Start Date End Date Taking? Authorizing Provider  HYDROcodone-acetaminophen (NORCO/VICODIN) 5-325 MG tablet Take 1 tablet by mouth every 6 (six) hours as needed for moderate pain. Patient not taking: Reported on 08/15/2020 02/06/20   Magnus Ivan,  Vanita Panda, MD  insulin NPH Human (NOVOLIN N) 100 UNIT/ML injection Inject 15 Units into the skin in the  morning and at bedtime.   Yes [provider]  insulin regular (NOVOLIN R) 100 units/mL injection Inject 5-10 Units into the skin 3 (three) times daily before meals. Per sliding scale   Yes [provider]  phenol (CHLORASEPTIC) 1.4 % LIQD Use as directed 1 spray in the mouth or throat as needed for throat irritation / pain. Patient not taking: No sig reported 03/11/19   Maurilio Lovely D, DO    Physical Exam: BP 140/68   Pulse 89   Temp 98.7 F (37.1 C) (Oral)   Resp 15   Ht 5\' 11"  (1.803 m)   Wt 62.8 kg   SpO2 100%   BMI 19.31 kg/m   General: 40 y.o. year-old male well developed well nourished in no acute distress.  Alert and oriented x3. HEENT: Dry mucous membrane.  NCAT, EOMI Neck: Supple, trachea medial Cardiovascular: Regular rate and rhythm with no rubs or gallops.  No thyromegaly or JVD noted.  No lower extremity edema. 2/4 pulses in all 4 extremities. Respiratory: Clear to auscultation with no wheezes or rales. Good inspiratory effort. Abdomen: Soft, tender to palpation of epigastric area without guarding.  Normal bowel sounds x4 quadrants. Muskuloskeletal: No cyanosis, clubbing or edema noted bilaterally Neuro: CN II-XII intact, strength 5/5 x 4, sensation, reflexes intact Skin: No ulcerative lesions noted or rashes Psychiatry: Mood is appropriate for condition and setting          Labs on Admission:  Basic Metabolic Panel: Recent Labs  Lab 08/15/20 1602 08/15/20 1952  NA 130* 131*  K 4.8 4.4  CL 96* 99  CO2 9* 13*  GLUCOSE 97 126*  BUN 14 12  CREATININE 1.14 1.03  CALCIUM 8.9 8.2*   Liver Function Tests: Recent Labs  Lab 08/15/20 1602  AST 30  ALT 50*  ALKPHOS 135*  BILITOT 2.2*  PROT 8.0  ALBUMIN 4.5   Recent Labs  Lab 08/15/20 1602  LIPASE 92*   No results for input(s): AMMONIA in the last 168 hours. CBC: Recent Labs  Lab 08/15/20 1602  WBC 14.4*  NEUTROABS 12.1*  HGB 14.2  HCT 41.3  MCV 106.4*  PLT 238   Cardiac  Enzymes: No results for input(s): CKTOTAL, CKMB, CKMBINDEX, TROPONINI in the last 168 hours.  BNP (last 3 results) No results for input(s): BNP in the last 8760 hours.  ProBNP (last 3 results) No results for input(s): PROBNP in the last 8760 hours.  CBG: Recent Labs  Lab 08/15/20 1540 08/15/20 1730 08/15/20 1801 08/15/20 1858 08/15/20 2000  GLUCAP 94 112* 113* 114* 143*    Radiological Exams on Admission: DG Chest Port 1 View  Result Date: 08/15/2020 CLINICAL DATA:  Fever, nausea and vomiting EXAM: PORTABLE CHEST 1 VIEW COMPARISON:  01/21/2020 chest radiograph. FINDINGS: Stable cardiomediastinal silhouette with normal heart size. No pneumothorax. No pleural effusion. Lungs appear clear, with no acute consolidative airspace disease and no pulmonary edema. IMPRESSION: No active disease. Electronically Signed   By: 01/23/2020 M.D.   On: 08/15/2020 16:53    EKG: I independently viewed the EKG done and my findings are as followed: Normal sinus rhythm at a rate of 86 bpm with ST elevation in inferior leads and peaked T waves in lateral leads  Assessment/Plan Present on Admission:  DKA (diabetic ketoacidosis) (HCC)  Principal Problem:   DKA (diabetic ketoacidosis) (HCC) Active Problems:  Elevated lipase   Hyponatremia   Transaminitis   Serum total bilirubin elevated   Drug abuse (HCC)   Leukocytosis   Elevated MCV   Alcohol abuse   H/O medication noncompliance   Nausea & vomiting   Anion gap metabolic acidosis secondary to DKA Anion gap 25, beta hydroxybutyric acid > 8.0 Continue insulin drip, IV LR with IV potassium per DKA protocol Transition IV LR to D5 LR when serum glucose reaches 250mg /dL Continue serial BMP and VBG Continue to monitor for anion gap closure prior to transitioning patient to subcu insulin Continue NPO  Nausea and vomiting Continue Zofran as needed  Hyponatremia possibly due to dehydration/beer potomania Na 131; continue IV  hydration  Transaminitis possibly secondary to shock liver ALT 50, ALP 135, patient denies right upper quadrant pain Continue to monitor liver enzymes with morning labs  Elevated total bilirubin Total bilirubin 2.2; direct bilirubin will be checked  Elevated lipase level Lipase 92, continue to monitor lipase level  Leukocytosis 14.4; no acute concern for an infectious process at this time Continue to monitor WBC with morning labs  Elevated MCV MCV 106.4; folate levels and vitamin B12 will be checked  Noncompliance to medication regimen Patient was counseled on importance of being compliant with insulin treatment regimen  History of alcohol abuse/tobacco/drug abuse Urine drug screen was positive for THC; alcohol level was < 10 Patient states that he has never withdrawn from alcohol, continue to monitor patient for withdrawal symptoms and treat accordingly Patient counseled on tobacco, alcohol and THC use cessation  DVT prophylaxis: Lovenox  Code Status: Full code  Family Communication: None at bedside  Disposition Plan:  Patient is from:                        home Anticipated DC to:                   SNF or family members home Anticipated DC date:               2-3 days Anticipated DC barriers:         Patient requires inpatient management due to DKA requiring IV insulin drip per DKA Endo tool   Consults called: None  Admission status: Observation   MD [facility]  08/15/2020, 8:50 PM

## 2020-08-15 NOTE — ED Triage Notes (Signed)
Pt with sob for past 2 days.  No fever noted.  Pt does not know his blood sugar.  +Emesis and diarrhea.

## 2020-08-15 NOTE — ED Provider Notes (Signed)
[facility] Provider Note   CSN: [id number] Arrival date & time: 08/15/20  1531     History Chief Complaint  Patient presents with   Shortness of Breath    Tommy Martinez is a 40 y.o. male.  HPI  Patient with significant medical history of type 1 diabetes noncompliant, presents to the emergency department with chief complaint of shortness of breath and nausea and vomiting.  Patient states this started 2 days ago, states that he has been unable to tolerate p.o., has had frequent vomiting, denies coffee-ground emesis or hematemesis, he has epigastric pain does not radiate, he has no urinary symptoms, denies constipation, diarrhea, denies fevers, chills, nasal gesturing sore throat cough or general body aches.  He also states that since the nausea and vomiting he also feels short of breath, states it only happens on exertion, has no associated chest pain, becoming diaphoretic, denies cardiac history, no history of PEs or DVTs, denies any leg pain.  He denies recent sick contacts, is up-to-date on his COVID-vaccine.  He does endorse that he has had DKA and states this feels similar to previous events.  He states that he has not been very compliant with his medication, states that he has not taken it yesterday but took some this morning.  Endorses increased thirst frequent urination but has no other complaints.  Patient also smoked marijuana on Wednesday and symptoms started on Thursday states he generally does smoke marijuana but denies ever having vomiting after smoking.  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 No pertinent surgical history.     History reviewed. No pertinent family history.  Social History   Tobacco Use    Smoking status: Every Day   Smokeless tobacco: Never  Substance Use Topics   Alcohol use: Yes    Comment: 1/5 liquor/day   Drug use: Not Currently    Home Medications Prior to Admission medications   Medication Sig Start Date End Date Taking? Authorizing Provider  HYDROcodone-acetaminophen (NORCO/VICODIN) 5-325 MG tablet Take 1 tablet by mouth every 6 (six) hours as needed for moderate pain. Patient not taking: Reported on 08/15/2020 02/06/20   Mcarthur Rossetti, MD  insulin NPH Human (NOVOLIN N) 100 UNIT/ML injection Inject 15 Units into the skin in the morning and at bedtime.   Yes [provider]  insulin regular (NOVOLIN R) 100 units/mL injection Inject 5-10 Units into the skin 3 (three) times daily before meals. Per sliding scale   Yes [provider]  phenol (CHLORASEPTIC) 1.4 % LIQD Use as directed 1 spray in the mouth or throat as needed for throat irritation / pain. Patient not taking: No sig reported 03/11/19   Heath Lark D, DO    Allergies    Patient has no known allergies.  Review of Systems   Review of Systems  Constitutional:  Negative for chills and fever.  HENT:  Negative for congestion.   Respiratory:  Positive for shortness of breath.   Cardiovascular:  Negative for chest pain.  Gastrointestinal:  Positive for abdominal pain, nausea and vomiting. Negative for constipation and diarrhea.  Genitourinary:  Negative for enuresis.  Musculoskeletal:  Negative for back pain.  Skin:  Negative for rash.  Neurological:  Positive  for headaches. Negative for dizziness.  Hematological:  Does not bruise/bleed easily.   Physical Exam Updated Vital Signs BP (!) 138/92   Pulse 86   Temp 97.6 F (36.4 C) (Oral)   Resp 20   Ht '5\' 11"'  (1.803 m)   Wt 68 kg   SpO2 100%   BMI 20.92 kg/m   Physical Exam Vitals and nursing note reviewed.  Constitutional:      General: He is not in acute distress.    Appearance: He is not ill-appearing.  HENT:      Head: Normocephalic and atraumatic.     Nose: No congestion.     Mouth/Throat:     Mouth: Mucous membranes are moist.     Pharynx: Oropharynx is clear.  Eyes:     Conjunctiva/sclera: Conjunctivae normal.  Cardiovascular:     Rate and Rhythm: Normal rate and regular rhythm.     Pulses: Normal pulses.     Heart sounds: No murmur heard.   No friction rub. No gallop.  Pulmonary:     Effort: No respiratory distress.     Breath sounds: No wheezing, rhonchi or rales.  Abdominal:     Palpations: Abdomen is soft.     Tenderness: There is abdominal tenderness. There is no right CVA tenderness or left CVA tenderness.     Comments: Abdomen was visualized nondistended, normal bowel sounds, dull to percussion, tenderness in his epigastric region, no guarding, rebound tenderness, peritoneal sign.  Musculoskeletal:     Right lower leg: No edema.     Left lower leg: No edema.  Skin:    General: Skin is warm and dry.  Neurological:     Mental Status: He is alert.     Comments: No facial asymmetry, no difficulty with word finding, no slurring of words, no unilateral weakness, able to follow commands.  Psychiatric:        Mood and Affect: Mood normal.    ED Results / Procedures / Treatments   Labs (all labs ordered are listed, but only abnormal results are displayed) Labs Reviewed  COMPREHENSIVE METABOLIC PANEL - Abnormal; Notable for the following components:      Result Value   Sodium 130 (*)    Chloride 96 (*)    CO2 9 (*)    ALT 50 (*)    Alkaline Phosphatase 135 (*)    Total Bilirubin 2.2 (*)    Anion gap 25 (*)    All other components within normal limits  LIPASE, BLOOD - Abnormal; Notable for the following components:   Lipase 92 (*)    All other components within normal limits  CBC WITH DIFFERENTIAL/PLATELET - Abnormal; Notable for the following components:   WBC 14.4 (*)    RBC 3.88 (*)    MCV 106.4 (*)    MCH 36.6 (*)    Neutro Abs 12.1 (*)    Monocytes Absolute 1.3 (*)     Abs Immature Granulocytes 0.11 (*)    All other components within normal limits  BLOOD GAS, VENOUS - Abnormal; Notable for the following components:   pH, Ven 7.208 (*)    pCO2, Ven 23.4 (*)    pO2, Ven <31.0 (*)    Bicarbonate 10.8 (*)    Acid-base deficit 17.5 (*)    All other components within normal limits  BETA-HYDROXYBUTYRIC ACID - Abnormal; Notable for the following components:   Beta-Hydroxybutyric Acid >8.00 (*)    All other components within normal limits  CBG MONITORING, ED -  Abnormal; Notable for the following components:   Glucose-Capillary 112 (*)    All other components within normal limits  CBG MONITORING, ED - Abnormal; Notable for the following components:   Glucose-Capillary 113 (*)    All other components within normal limits  CBG MONITORING, ED - Abnormal; Notable for the following components:   Glucose-Capillary 114 (*)    All other components within normal limits  RESP PANEL BY RT-PCR (FLU A&B, COVID) ARPGX2  ETHANOL  URINALYSIS, ROUTINE W REFLEX MICROSCOPIC  RAPID URINE DRUG SCREEN, HOSP PERFORMED  BASIC METABOLIC PANEL  BASIC METABOLIC PANEL  BASIC METABOLIC PANEL  BETA-HYDROXYBUTYRIC ACID  CBG MONITORING, ED  TROPONIN I (HIGH SENSITIVITY)    EKG EKG Interpretation  Date/Time:  Saturday August 15 2020 15:53:49 EDT Ventricular Rate:  86 PR Interval:  147 QRS Duration: 94 QT Interval:  370 QTC Calculation: 443 R Axis:   97 Text Interpretation: Sinus rhythm Inferior infarct, acute (RCA) ST elevation, consider anterior injury Lateral leads are also involved Probable RV involvement, suggest recording right precordial leads >>> Acute MI <<< Confirmed by Milton Ferguson [phone]) on 08/15/2020 4:00:14 PM  Radiology DG Chest Port 1 View  Result Date: 08/15/2020 CLINICAL DATA:  Fever, nausea and vomiting EXAM: PORTABLE CHEST 1 VIEW COMPARISON:  01/21/2020 chest radiograph. FINDINGS: Stable cardiomediastinal silhouette with normal heart size. No  pneumothorax. No pleural effusion. Lungs appear clear, with no acute consolidative airspace disease and no pulmonary edema. IMPRESSION: No active disease. Electronically Signed   By: Ilona Sorrel M.D.   On: 08/15/2020 16:53    Procedures .Critical Care  Date/Time: 08/15/2020 7:06 PM Performed by: Marcello Fennel, PA-C Authorized by: Marcello Fennel, PA-C   Critical care provider statement:    Critical care time (minutes):  45   Critical care time was exclusive of:  Separately billable procedures and treating other patients   Critical care was necessary to treat or prevent imminent or life-threatening deterioration of the following conditions:  Metabolic crisis   Critical care was time spent personally by me on the following activities:  Discussions with consultants, evaluation of patient's response to treatment, examination of patient, ordering and performing treatments and interventions, ordering and review of laboratory studies, ordering and review of radiographic studies, pulse oximetry, re-evaluation of patient's condition and review of old charts   I assumed direction of critical care for this patient from another provider in my specialty: no     Care discussed with: admitting provider     Medications Ordered in ED Medications  insulin regular, human (MYXREDLIN) 100 units/ 100 mL infusion (1 Units/hr Intravenous Rate/Dose Change 08/15/20 1901)  lactated ringers infusion ( Intravenous Not Given 08/15/20 1901)  dextrose 5 % in lactated ringers infusion ( Intravenous New Bag/Given 08/15/20 1900)  dextrose 50 % solution 0-50 mL (has no administration in time range)  potassium chloride 10 mEq in 100 mL IVPB (10 mEq Intravenous New Bag/Given 08/15/20 1831)  sodium chloride 0.9 % bolus 1,000 mL (0 mLs Intravenous Stopped 08/15/20 1725)  ondansetron (ZOFRAN) injection 4 mg (4 mg Intravenous Given 08/15/20 1609)  lactated ringers bolus 1,360 mL (0 mL/kg  68 kg Intravenous Stopped 08/15/20  1858)  acetaminophen (TYLENOL) tablet 650 mg (650 mg Oral Given 08/15/20 1742)    ED Course  I have reviewed the triage vital signs and the nursing notes.  Pertinent labs & imaging results that were available during my care of the patient were reviewed by me and considered in my medical  decision making (see chart for details).    MDM Rules/Calculators/A&P                          Initial impression-patient presents with nausea vomiting shortness of breath.  He is alert, does not appear acute stress, vital signs reassuring.  Will obtain basic lab work-up, provide patient fluids, antiemetics, and reassess.  Work-up-CBC shows leukocytosis of 14.4, CMP shows hyponatremia 130, CO2 of 9, ALT 50, alk phos 135, T bili 2.2, and gap of 25, lipase 92, troponin 3, venous blood gas shows a pH of 7.2, CO2 of 23, bicarb 10, chest x-ray negative for acute findings, EKG sinus without signs of ischemia  Reassessment-notified that patient's pH is 7.2, bicarb is 10, suspect patient is in DKA, due to poor compliance, will start him on the Endo tool, and recommend admission.    Patient was agreeable to this plan.  Will consult hospitalist for further evaluation.  Consult- spoke with Dr. Josephine Cables of the hospitalist team he would like cardiology consult due to abnormal EKG,  Spoke with Dr. Randa Lynn of cardiology he feels this is early repull likely from electrolyte derailments, and does not feel there is any concern for a STEMI.  Updated Dr. Josephine Cables and he will admit the patient.    Rule out-I have low suspicion for systemic infection as patient is nontoxic-appearing, vital signs reassuring, he does have leukocytosis of 14.4 but I suspect this is an acute phase reactant from the DKA, chest x-ray is negative for acute findings, UA still pending but he does not endorse flank pain, dysuria, hematuria or urgency.  I have low suspicion for pancreatitis as lipase is not 3 times above the upper limit, I suspect this is  elevated from the consistent vomiting.  Low suspicion for pneumoperitoneum as abdomen is nondistended dull to percussion, no peritoneal sign present my exam.  It is noted that patient has epigastric pain but I suspect this is likely from the consistent vomiting, will defer imaging at this time as he has no peritoneal sign, no guarding, no abdominal distention, still passing flatus.  Low suspicion for ACS as patient denies chest pain, EKG is without signs of ischemia, first troponin is 3.  Will defer second troponin as patient has been chest pain-free for greater than 12 hours, if ACS was present would expect elevation in troponins.  Plan-admit to medicine due to DKA.  Final Clinical Impression(s) / ED Diagnoses Final diagnoses:  Diabetic ketoacidosis without coma associated with type 1 diabetes mellitus [facility])    Rx / DC Orders ED Discharge Orders     None        Aron Baba 08/15/20 Rayford Halsted, MD 08/17/20 1000

## 2020-08-16 DIAGNOSIS — R0602 Shortness of breath: Secondary | ICD-10-CM | POA: Diagnosis present

## 2020-08-16 DIAGNOSIS — R748 Abnormal levels of other serum enzymes: Secondary | ICD-10-CM | POA: Diagnosis present

## 2020-08-16 DIAGNOSIS — E871 Hypo-osmolality and hyponatremia: Secondary | ICD-10-CM | POA: Diagnosis present

## 2020-08-16 DIAGNOSIS — D72829 Elevated white blood cell count, unspecified: Secondary | ICD-10-CM | POA: Diagnosis present

## 2020-08-16 DIAGNOSIS — Z9114 Patient's other noncompliance with medication regimen: Secondary | ICD-10-CM | POA: Diagnosis not present

## 2020-08-16 DIAGNOSIS — Z20822 Contact with and (suspected) exposure to covid-19: Secondary | ICD-10-CM | POA: Diagnosis present

## 2020-08-16 DIAGNOSIS — F1010 Alcohol abuse, uncomplicated: Secondary | ICD-10-CM | POA: Diagnosis present

## 2020-08-16 DIAGNOSIS — T383X6A Underdosing of insulin and oral hypoglycemic [antidiabetic] drugs, initial encounter: Secondary | ICD-10-CM | POA: Diagnosis present

## 2020-08-16 DIAGNOSIS — K219 Gastro-esophageal reflux disease without esophagitis: Secondary | ICD-10-CM | POA: Diagnosis present

## 2020-08-16 DIAGNOSIS — R7401 Elevation of levels of liver transaminase levels: Secondary | ICD-10-CM | POA: Diagnosis present

## 2020-08-16 DIAGNOSIS — R718 Other abnormality of red blood cells: Secondary | ICD-10-CM | POA: Diagnosis not present

## 2020-08-16 DIAGNOSIS — R17 Unspecified jaundice: Secondary | ICD-10-CM | POA: Diagnosis present

## 2020-08-16 DIAGNOSIS — R45851 Suicidal ideations: Secondary | ICD-10-CM | POA: Diagnosis present

## 2020-08-16 DIAGNOSIS — E1010 Type 1 diabetes mellitus with ketoacidosis without coma: Secondary | ICD-10-CM | POA: Diagnosis present

## 2020-08-16 DIAGNOSIS — Z91128 Patient's intentional underdosing of medication regimen for other reason: Secondary | ICD-10-CM | POA: Diagnosis not present

## 2020-08-16 DIAGNOSIS — R9431 Abnormal electrocardiogram [ECG] [EKG]: Secondary | ICD-10-CM | POA: Diagnosis present

## 2020-08-16 DIAGNOSIS — E860 Dehydration: Secondary | ICD-10-CM | POA: Diagnosis present

## 2020-08-16 DIAGNOSIS — F17210 Nicotine dependence, cigarettes, uncomplicated: Secondary | ICD-10-CM | POA: Diagnosis present

## 2020-08-16 LAB — BETA-HYDROXYBUTYRIC ACID
Beta-Hydroxybutyric Acid: 4.86 mmol/L — ABNORMAL HIGH (ref 0.05–0.27)
Beta-Hydroxybutyric Acid: 4.32 mmol/L — ABNORMAL HIGH (ref 0.05–0.27)

## 2020-08-16 LAB — COMPREHENSIVE METABOLIC PANEL
ALT: 30 U/L (ref 0–44)
AST: 21 U/L (ref 15–41)
Albumin: 3.3 g/dL — ABNORMAL LOW (ref 3.5–5.0)
Alkaline Phosphatase: 99 U/L (ref 38–126)
Anion gap: 6 (ref 5–15)
BUN: 7 mg/dL (ref 6–20)
CO2: 27 mmol/L (ref 22–32)
Calcium: 8.3 mg/dL — ABNORMAL LOW (ref 8.9–10.3)
Chloride: 101 mmol/L (ref 98–111)
Creatinine, Ser: 0.76 mg/dL (ref 0.61–1.24)
GFR, Estimated: >60 mL/min (ref >60)
Glucose, Bld: 180 mg/dL — ABNORMAL HIGH (ref 70–99)
Potassium: 3.4 mmol/L — ABNORMAL LOW (ref 3.5–5.1)
Sodium: 134 mmol/L — ABNORMAL LOW (ref 135–145)
Total Bilirubin: 0.8 mg/dL (ref 0.3–1.2)
Total Protein: 5.7 g/dL — ABNORMAL LOW (ref 6.5–8.1)

## 2020-08-16 LAB — CBC
HCT: 34.3 % — ABNORMAL LOW (ref 39.0–52.0)
Hemoglobin: 12.3 g/dL — ABNORMAL LOW (ref 13.0–17.0)
MCH: 36.6 pg — ABNORMAL HIGH (ref 26.0–34.0)
MCHC: 35.9 g/dL (ref 30.0–36.0)
MCV: 102.1 fL — ABNORMAL HIGH (ref 80.0–100.0)
Platelets: 174 10*3/uL (ref 150–400)
RBC: 3.36 MIL/uL — ABNORMAL LOW (ref 4.22–5.81)
RDW: 15.1 % (ref 11.5–15.5)
WBC: 6.4 10*3/uL (ref 4.0–10.5)
nRBC: 0 % (ref 0.0–0.2)

## 2020-08-16 LAB — MRSA NEXT GEN BY PCR, NASAL: MRSA by PCR Next Gen: NOT DETECTED

## 2020-08-16 LAB — BASIC METABOLIC PANEL
Anion gap: 10 (ref 5–15)
Anion gap: 10 (ref 5–15)
Anion gap: 10 (ref 5–15)
BUN: 9 mg/dL (ref 6–20)
BUN: 8 mg/dL (ref 6–20)
BUN: 7 mg/dL (ref 6–20)
CO2: 20 mmol/L — ABNORMAL LOW (ref 22–32)
CO2: 22 mmol/L (ref 22–32)
CO2: 22 mmol/L (ref 22–32)
Calcium: 8.1 mg/dL — ABNORMAL LOW (ref 8.9–10.3)
Calcium: 8.4 mg/dL — ABNORMAL LOW (ref 8.9–10.3)
Calcium: 8.3 mg/dL — ABNORMAL LOW (ref 8.9–10.3)
Chloride: 101 mmol/L (ref 98–111)
Chloride: 102 mmol/L (ref 98–111)
Chloride: 101 mmol/L (ref 98–111)
Creatinine, Ser: 0.83 mg/dL (ref 0.61–1.24)
Creatinine, Ser: 0.81 mg/dL (ref 0.61–1.24)
Creatinine, Ser: 0.81 mg/dL (ref 0.61–1.24)
GFR, Estimated: >60 mL/min (ref >60)
GFR, Estimated: >60 mL/min (ref >60)
GFR, Estimated: >60 mL/min (ref >60)
Glucose, Bld: 137 mg/dL — ABNORMAL HIGH (ref 70–99)
Glucose, Bld: 149 mg/dL — ABNORMAL HIGH (ref 70–99)
Glucose, Bld: 246 mg/dL — ABNORMAL HIGH (ref 70–99)
Potassium: 3.5 mmol/L (ref 3.5–5.1)
Potassium: 3.7 mmol/L (ref 3.5–5.1)
Potassium: 3.3 mmol/L — ABNORMAL LOW (ref 3.5–5.1)
Sodium: 131 mmol/L — ABNORMAL LOW (ref 135–145)
Sodium: 134 mmol/L — ABNORMAL LOW (ref 135–145)
Sodium: 133 mmol/L — ABNORMAL LOW (ref 135–145)

## 2020-08-16 LAB — GLUCOSE, CAPILLARY
Glucose-Capillary: 133 mg/dL — ABNORMAL HIGH (ref 70–99)
Glucose-Capillary: 135 mg/dL — ABNORMAL HIGH (ref 70–99)
Glucose-Capillary: 141 mg/dL — ABNORMAL HIGH (ref 70–99)
Glucose-Capillary: 143 mg/dL — ABNORMAL HIGH (ref 70–99)
Glucose-Capillary: 149 mg/dL — ABNORMAL HIGH (ref 70–99)
Glucose-Capillary: 153 mg/dL — ABNORMAL HIGH (ref 70–99)
Glucose-Capillary: 157 mg/dL — ABNORMAL HIGH (ref 70–99)
Glucose-Capillary: 217 mg/dL — ABNORMAL HIGH (ref 70–99)
Glucose-Capillary: 227 mg/dL — ABNORMAL HIGH (ref 70–99)
Glucose-Capillary: 234 mg/dL — ABNORMAL HIGH (ref 70–99)
Glucose-Capillary: 265 mg/dL — ABNORMAL HIGH (ref 70–99)

## 2020-08-16 LAB — BILIRUBIN, DIRECT: Bilirubin, Direct: 0.3 mg/dL — ABNORMAL HIGH (ref 0.0–0.2)

## 2020-08-16 LAB — PHOSPHORUS: Phosphorus: 1.2 mg/dL — ABNORMAL LOW (ref 2.5–4.6)

## 2020-08-16 LAB — HIV ANTIBODY (ROUTINE TESTING W REFLEX): HIV Screen 4th Generation wRfx: NONREACTIVE

## 2020-08-16 LAB — VITAMIN B12: Vitamin B-12: 433 pg/mL (ref 180–914)

## 2020-08-16 LAB — MAGNESIUM: Magnesium: 1.8 mg/dL (ref 1.7–2.4)

## 2020-08-16 LAB — FOLATE: Folate: 5.2 ng/mL — ABNORMAL LOW (ref >5.9)

## 2020-08-16 MED ORDER — THIAMINE HCL 100 MG/ML IJ SOLN: 100.0000 mg | Freq: Every day | INTRAMUSCULAR | Status: DC

## 2020-08-16 MED ORDER — LORAZEPAM 1 MG PO TABS: 1.0000 mg | ORAL_TABLET | ORAL | Status: DC | PRN

## 2020-08-16 MED ORDER — ADULT MULTIVITAMIN W/MINERALS CH
1.0000 | ORAL_TABLET | Freq: Every day | ORAL | Status: DC
  Administered 2020-08-16 – 2020-08-18 (×3): 1 via ORAL
  Filled 2020-08-16 (×3): qty 1

## 2020-08-16 MED ORDER — PANTOPRAZOLE SODIUM 40 MG PO TBEC
40.0000 mg | DELAYED_RELEASE_TABLET | Freq: Every day | ORAL | Status: DC
  Administered 2020-08-16 – 2020-08-18 (×3): 40 mg via ORAL
  Filled 2020-08-16 (×3): qty 1

## 2020-08-16 MED ORDER — MAGNESIUM SULFATE 2 GM/50ML IV SOLN
2.0000 g | Freq: Once | INTRAVENOUS | Status: AC
  Administered 2020-08-16: 2 g via INTRAVENOUS
  Filled 2020-08-16: qty 50

## 2020-08-16 MED ORDER — INSULIN ASPART 100 UNIT/ML IJ SOLN
0.0000 [IU] | Freq: Every day | INTRAMUSCULAR | Status: DC
Start: 2020-08-16 — End: 2020-08-18
  Administered 2020-08-16: 2 [IU] via SUBCUTANEOUS
  Administered 2020-08-17: 3 [IU] via SUBCUTANEOUS

## 2020-08-16 MED ORDER — INSULIN ASPART 100 UNIT/ML IJ SOLN
4.0000 [IU] | Freq: Three times a day (TID) | INTRAMUSCULAR | Status: DC
  Administered 2020-08-16 (×2): 4 [IU] via SUBCUTANEOUS

## 2020-08-16 MED ORDER — POTASSIUM CHLORIDE CRYS ER 20 MEQ PO TBCR
40.0000 meq | EXTENDED_RELEASE_TABLET | ORAL | Status: AC
Start: 2020-08-16 — End: 2020-08-16
  Administered 2020-08-16 (×2): 40 meq via ORAL
  Filled 2020-08-16 (×2): qty 2

## 2020-08-16 MED ORDER — LACTATED RINGERS IV SOLN: INTRAVENOUS | Status: DC

## 2020-08-16 MED ORDER — RINGERS IV SOLN: INTRAVENOUS | Status: DC

## 2020-08-16 MED ORDER — INSULIN ASPART 100 UNIT/ML IJ SOLN
0.0000 [IU] | Freq: Three times a day (TID) | INTRAMUSCULAR | Status: DC
  Administered 2020-08-16: 2 [IU] via SUBCUTANEOUS
  Administered 2020-08-16: 5 [IU] via SUBCUTANEOUS
  Administered 2020-08-17: 3 [IU] via SUBCUTANEOUS
  Administered 2020-08-18: 5 [IU] via SUBCUTANEOUS
  Administered 2020-08-18: 3 [IU] via SUBCUTANEOUS

## 2020-08-16 MED ORDER — THIAMINE HCL 100 MG PO TABS
100.0000 mg | ORAL_TABLET | Freq: Every day | ORAL | Status: DC
  Administered 2020-08-16 – 2020-08-18 (×3): 100 mg via ORAL
  Filled 2020-08-16 (×3): qty 1

## 2020-08-16 MED ORDER — INSULIN DETEMIR 100 UNIT/ML ~~LOC~~ SOLN
13.0000 [IU] | Freq: Two times a day (BID) | SUBCUTANEOUS | Status: DC
  Administered 2020-08-16 (×2): 13 [IU] via SUBCUTANEOUS
  Filled 2020-08-16 (×3): qty 0.13

## 2020-08-16 MED ORDER — FOLIC ACID 1 MG PO TABS
1.0000 mg | ORAL_TABLET | Freq: Every day | ORAL | Status: DC
  Administered 2020-08-16 – 2020-08-18 (×3): 1 mg via ORAL
  Filled 2020-08-16 (×3): qty 1

## 2020-08-16 MED ORDER — LORAZEPAM 2 MG/ML IJ SOLN
1.0000 mg | INTRAMUSCULAR | Status: DC | PRN
  Administered 2020-08-16: 2 mg via INTRAVENOUS
  Filled 2020-08-16: qty 1

## 2020-08-16 NOTE — Progress Notes (Signed)
PROGRESS NOTE    Tommy Martinez  ZOX:[id number] DOB: 11-24-1980 DOA: 08/15/2020 PCP: Assunta FoundGolding, John, MD    Chief Complaint  Patient presents with   Shortness of Breath    Brief admission narrative:  As per H&P written by Dr. Thomes DinningAdefeso on 08/15/2020 Tommy Martinez is a 40 y.o. male with medical history significant for type I DM, noncompliance with medication regimen, alcohol abuse, tobacco and recreational drug use who presents to the emergency department due to 2-day onset of abdominal pain associated with nausea and nonbloody vomiting with difficulty in being able to tolerate any oral intake.  Patient complains of nonradiating, persistent sharp epigastric pain of moderate intensity with no known alleviating/aggravating factor.  Patient states that he has not been very compliant with his medication, he states that he took his insulin this morning, though he did not take it yesterday.  He complains of thirst, excessive urination with no burning sensation or any other irritative bladder symptoms on urination.  Patient states that he drinks 1/5 of hard liquor and denies withdrawal symptoms or seizures due to alcohol withdrawal. he states that he has not drank any alcohol since past 2 days.  He states that he smokes 1 pack of cigarettes daily for many years and endorsed use of marijuana occasionally.  Patient complained of shortness of breath on exertion which started few hours PTA.  He denies nausea, vomiting, headache, chest pain or diarrhea. Recent hospitalization: 2/13-2/15 due to DKA  Assessment & Plan: 1-DKA (diabetic ketoacidosis) (HCC) -In the setting of medication noncompliance and the use of alcohol -Currently with resolution of acute DKA. -Patient will be transition of insulin drip into the use of long-acting, sliding scale insulin and meal coverage. -Will follow A1c. -Education about importance of medication compliance provided.  2-alcohol abuse -Currently no active withdrawal -Cessation  counseling provided -Will utilize CIWA protocol, thiamine and folic acid. -Patient entered into pursued rehabilitation. -TOC counseling placed.  3-Elevated MCV -MCV 106.4 -Follow-up B12 level -Most likely in the setting of alcohol abuse -Folic acid has been initiated.  4-Hyponatremia -Pseudohyponatremia in the setting of dehydration and hyperglycemia from DKA. -Alcohol abuse also playing a role -Continue fluid resuscitation and further management of patient's CBGs -Follow electrolytes trend.  5-Transaminitis and Serum total bilirubin elevated -In the setting of alcohol abuse -Continue fluid resuscitation and supportive care -No icterus or jaundice appreciated on examination -Follow LFTs trend. -Alcohol cessation counseling provided.  6-gastroesophageal reflux disease/epigastric pain -Will concern for gastritis and esophagitis probably triggered by alcohol abuse. -Will provide PPI. -As needed antiemetics will be available.   DVT prophylaxis: SCDs Code Status: Full code Family Communication: No family at bedside. Disposition:   Status is: Inpatient  The patient will require care spanning > 2 midnights and should be moved to inpatient because: IV treatments appropriate due to intensity of illness or inability to take PO  Dispo: The patient is from: Home              Anticipated d/c is to: Home              Patient currently is not medically stable to d/c.   Difficult to place patient No     Consultants:  None  Procedures:  See below for x-ray reports.  Antimicrobials:  None   Subjective: No chest pain, reports no nausea vomiting currently.  Patient is hungry.  No acute withdrawal seen at this time.  Patient expressing some remorse and would like to pursue rehabilitation to quit drinking.  Expressed lack of compliance with his medication and ongoing alcohol abuse are responsible for his DKA process.  Objective: Vitals:   08/16/20 0808 08/16/20 0900 08/16/20  1000 08/16/20 1111  BP:  139/68 137/80   Pulse: 97 84 98   Resp:  14 15   Temp:    97.6 F (36.4 C)  TempSrc:    Oral  SpO2:  100% 99%   Weight:      Height:        Intake/Output Summary (Last 24 hours) at 08/16/2020 1204 Last data filed at 08/16/2020 0900 Gross per 24 hour  Intake 1950.81 ml  Output 700 ml  Net 1250.81 ml   Filed Weights   08/15/20 1539 08/15/20 2024  Weight: 68 kg 62.8 kg    Examination: General exam: afebrile, no CP, no nausea, no vomiting. Appears calm and comfortable.  Following commands appropriately.  Patient expressed to be hungry. Respiratory system: Clear to auscultation. Respiratory effort normal.  No using accessory muscles.  Good saturation on room air. Cardiovascular system: S1 & S2 heard, RRR. No JVD, murmurs, rubs, gallops or clicks. No pedal edema. Gastrointestinal system: Abdomen is nondistended, soft and nontender. No organomegaly or masses felt. Normal bowel sounds heard. Central nervous system: Alert and oriented. No focal neurological deficits. Extremities: No cyanosis or clubbing. Skin: No rashes, no petechiae. Psychiatry: Judgement and insight appear normal.  Flat affect.   Data Reviewed: I have personally reviewed following labs and imaging studies  CBC: Recent Labs  Lab 08/15/20 1602  WBC 14.4*  NEUTROABS 12.1*  HGB 14.2  HCT 41.3  MCV 106.4*  PLT 238    Basic Metabolic Panel: Recent Labs  Lab 08/15/20 1602 08/15/20 1952 08/16/20 0133 08/16/20 0532 08/16/20 0908  NA 130* 131* 131* 134* 133*  K 4.8 4.4 3.5 3.7 3.3*  CL 96* 99 101 102 101  CO2 9* 13* 20* 22 22  GLUCOSE 97 126* 137* 149* 246*  BUN 14 12 9 8 7   CREATININE 1.14 1.03 0.83 0.81 0.81  CALCIUM 8.9 8.2* 8.1* 8.4* 8.3*    GFR: Estimated Creatinine Clearance: 108.8 mL/min (by C-G formula based on SCr of 0.81 mg/dL).  Liver Function Tests: Recent Labs  Lab 08/15/20 1602  AST 30  ALT 50*  ALKPHOS 135*  BILITOT 2.2*  PROT 8.0  ALBUMIN 4.5     CBG: Recent Labs  Lab 08/16/20 0536 08/16/20 0739 08/16/20 0857 08/16/20 0958 08/16/20 1112  GLUCAP 153* 133* 227* 265* 217*    Recent Results (from the past 240 hour(s))  Resp Panel by RT-PCR (Flu A&B, Covid) Nasopharyngeal Swab     Status: None   Collection Time: 08/15/20  4:03 PM   Specimen: Nasopharyngeal Swab; Nasopharyngeal(NP) swabs in vial transport medium  Result Value Ref Range Status   SARS Coronavirus 2 by RT PCR NEGATIVE NEGATIVE Final    Comment: (NOTE) SARS-CoV-2 target nucleic acids are NOT DETECTED.  The SARS-CoV-2 RNA is generally detectable in upper respiratory specimens during the acute phase of infection. The lowest concentration of SARS-CoV-2 viral copies this assay can detect is 138 copies/mL. A negative result does not preclude SARS-Cov-2 infection and should not be used as the sole basis for treatment or other patient management decisions. A negative result may occur with  improper specimen collection/handling, submission of specimen other than nasopharyngeal swab, presence of viral mutation(s) within the areas targeted by this assay, and inadequate number of viral copies(<138 copies/mL). A negative result must be combined with clinical observations,  patient history, and epidemiological information. The expected result is Negative.  Fact Sheet for Patients:  BloggerCourse.com  Fact Sheet for Healthcare Providers:  SeriousBroker.it  This test is no t yet approved or cleared by the [place] FDA and  has been authorized for detection and/or diagnosis of SARS-CoV-2 by FDA under an Emergency Use Authorization (EUA). This EUA will remain  in effect (meaning this test can be used) for the duration of the COVID-19 declaration under Section 564(b)(1) of the Act, 21 U.S.C.section 360bbb-3(b)(1), unless the authorization is terminated  or revoked sooner.       Influenza A by PCR NEGATIVE  NEGATIVE Final   Influenza B by PCR NEGATIVE NEGATIVE Final    Comment: (NOTE) The Xpert Xpress SARS-CoV-2/FLU/RSV plus assay is intended as an aid in the diagnosis of influenza from Nasopharyngeal swab specimens and should not be used as a sole basis for treatment. Nasal washings and aspirates are unacceptable for Xpert Xpress SARS-CoV-2/FLU/RSV testing.  Fact Sheet for Patients: BloggerCourse.com  Fact Sheet for Healthcare Providers: SeriousBroker.it  This test is not yet approved or cleared by the [place] FDA and has been authorized for detection and/or diagnosis of SARS-CoV-2 by FDA under an Emergency Use Authorization (EUA). This EUA will remain in effect (meaning this test can be used) for the duration of the COVID-19 declaration under Section 564(b)(1) of the Act, 21 U.S.C. section 360bbb-3(b)(1), unless the authorization is terminated or revoked.  Performed at [facility], [address], [place], [place] [address]   MRSA Next Gen by PCR, Nasal     Status: None   Collection Time: 08/15/20  8:40 PM   Specimen: Nasal Mucosa; Nasal Swab  Result Value Ref Range Status   MRSA by PCR Next Gen NOT DETECTED NOT DETECTED Final    Comment: (NOTE) The GeneXpert MRSA Assay (FDA approved for NASAL specimens only), is one component of a comprehensive MRSA colonization surveillance program. It is not intended to diagnose MRSA infection nor to guide or monitor treatment for MRSA infections. Test performance is not FDA approved in patients less than 69 years old. Performed at [facility], [address], [place], [place] [address]      Radiology Studies: [facility] Chest [facility] 1 View  Result Date: 08/15/2020 CLINICAL DATA:  Fever, nausea and vomiting EXAM: PORTABLE CHEST 1 VIEW COMPARISON:  01/21/2020 chest radiograph. FINDINGS: Stable cardiomediastinal silhouette with normal heart size. No pneumothorax. No pleural effusion. Lungs appear  clear, with no acute consolidative airspace disease and no pulmonary edema. IMPRESSION: No active disease. Electronically Signed   By: Delbert Phenix M.D.   On: 08/15/2020 16:53     Scheduled Meds:  Chlorhexidine Gluconate Cloth  6 each Topical Q0600   enoxaparin (LOVENOX) injection  40 mg Subcutaneous Q24H   folic acid  1 mg Oral Daily   insulin aspart  0-15 Units Subcutaneous TID WC   insulin aspart  0-5 Units Subcutaneous QHS   insulin aspart  4 Units Subcutaneous TID WC   insulin detemir  13 Units Subcutaneous BID   multivitamin with minerals  1 tablet Oral Daily   potassium chloride  40 mEq Oral Q4H   thiamine  100 mg Oral Daily   Or   thiamine  100 mg Intravenous Daily   Continuous Infusions:  lactated ringers Stopped (08/15/20 1735)   lactated ringers 100 mL/hr at 08/16/20 0829   magnesium sulfate bolus IVPB       LOS: 0 days    Time spent:  35 minutes.   Vassie Loll, MD [facility]   To contact the attending provider between 7A-7P or the covering provider during after hours 7P-7A, please log into the web site [email or web address] and access using universal [place] password for that web site. If you do not have the password, please call the hospital operator.  08/16/2020, 12:04 PM

## 2020-08-16 NOTE — Progress Notes (Signed)
Inpatient Diabetes Program Recommendations  AACE/ADA: New Consensus Statement on Inpatient Glycemic Control  Target Ranges:  Prepandial:   less than 140 mg/dL      Peak postprandial:   less than 180 mg/dL (1-2 hours)      Critically ill patients:  140 - 180 mg/dL  Results for Tommy Martinez, Tommy Martinez (MRN [id number]) as of 08/16/2020 08:28  Ref. Range 08/16/2020 00:31 08/16/2020 01:44 08/16/2020 02:58 08/16/2020 04:09 08/16/2020 05:36 08/16/2020 07:39  Glucose-Capillary Latest Ref Range: 70 - 99 mg/dL 063 (H) 016 (H) 010 (H) 143 (H) 153 (H) 133 (H)   Results for Tommy Martinez, Tommy Martinez (MRN [id number]) as of 08/16/2020 08:28  Ref. Range 08/15/2020 16:02  Beta-Hydroxybutyric Acid Latest Ref Range: 0.05 - 0.27 mmol/L >8.00 (H)  Glucose Latest Ref Range: 70 - 99 mg/dL 97   Review of Glycemic Control  Diabetes history: DM1; makes NO insulin; requires basal, correction, and carb coverage insulin Outpatient Diabetes medications: NPH 15 units BID Current orders for Inpatient glycemic control: Levemir 13 units BID, Novolog 4 units TID with meals, Novolog 0-15 units TID with meals, Novolog 0-5 units QHS   NOTE: Noted consult for Diabetes Coordinator. Diabetes Coordinator is not on campus over the weekend but available by pager from 8am to 5pm for questions or concerns. Chart reviewed. Per H&P,  "Patient states that he has not been very compliant with his medication, he states that he took his insulin this morning, though he did not take it yesterday." Initial glucose 97 and beta-hydroxybutyric acid >8.0, CO2 9,AG 25 and patient was started on IV insulin for DKA. Noted orders for transition from IV to SQ insulin. Diabetes coordinator will follow up with patient on Monday.  Thanks, Orlando Penner, RN, MSN, CDE Diabetes Coordinator Inpatient Diabetes Program [phone] (Team Pager from 8am to 5pm)

## 2020-08-17 LAB — GLUCOSE, CAPILLARY
Glucose-Capillary: 100 mg/dL — ABNORMAL HIGH (ref 70–99)
Glucose-Capillary: 111 mg/dL — ABNORMAL HIGH (ref 70–99)
Glucose-Capillary: 129 mg/dL — ABNORMAL HIGH (ref 70–99)
Glucose-Capillary: 200 mg/dL — ABNORMAL HIGH (ref 70–99)
Glucose-Capillary: 232 mg/dL — ABNORMAL HIGH (ref 70–99)
Glucose-Capillary: 254 mg/dL — ABNORMAL HIGH (ref 70–99)
Glucose-Capillary: 281 mg/dL — ABNORMAL HIGH (ref 70–99)
Glucose-Capillary: 38 mg/dL — CL (ref 70–99)
Glucose-Capillary: 62 mg/dL — ABNORMAL LOW (ref 70–99)

## 2020-08-17 LAB — HEMOGLOBIN A1C
Hgb A1c MFr Bld: 9.2 % — ABNORMAL HIGH (ref 4.8–5.6)
Mean Plasma Glucose: 217 mg/dL

## 2020-08-17 MED ORDER — INSULIN ASPART 100 UNIT/ML IJ SOLN
3.0000 [IU] | Freq: Three times a day (TID) | INTRAMUSCULAR | Status: DC
  Administered 2020-08-17 – 2020-08-18 (×5): 3 [IU] via SUBCUTANEOUS

## 2020-08-17 MED ORDER — INSULIN DETEMIR 100 UNIT/ML ~~LOC~~ SOLN
10.0000 [IU] | Freq: Two times a day (BID) | SUBCUTANEOUS | Status: DC
  Administered 2020-08-17 – 2020-08-18 (×3): 10 [IU] via SUBCUTANEOUS
  Filled 2020-08-17 (×5): qty 0.1

## 2020-08-17 NOTE — Progress Notes (Signed)
PROGRESS NOTE    Tommy Martinez  BJY:[id number] DOB: 1980/10/15 DOA: 08/15/2020 PCP: Assunta FoundGolding, John, MD    Chief Complaint  Patient presents with   Shortness of Breath    Brief admission narrative:  As per H&P written by Dr. Thomes DinningAdefeso on 08/15/2020 Tommy Martinez is a 40 y.o. male with medical history significant for type I DM, noncompliance with medication regimen, alcohol abuse, tobacco and recreational drug use who presents to the emergency department due to 2-day onset of abdominal pain associated with nausea and nonbloody vomiting with difficulty in being able to tolerate any oral intake.  Patient complains of nonradiating, persistent sharp epigastric pain of moderate intensity with no known alleviating/aggravating factor.  Patient states that he has not been very compliant with his medication, he states that he took his insulin this morning, though he did not take it yesterday.  He complains of thirst, excessive urination with no burning sensation or any other irritative bladder symptoms on urination.  Patient states that he drinks 1/5 of hard liquor and denies withdrawal symptoms or seizures due to alcohol withdrawal. he states that he has not drank any alcohol since past 2 days.  He states that he smokes 1 pack of cigarettes daily for many years and endorsed use of marijuana occasionally.  Patient complained of shortness of breath on exertion which started few hours PTA.  He denies nausea, vomiting, headache, chest pain or diarrhea. Recent hospitalization: 2/13-2/15 due to DKA  Assessment & Plan: 1-DKA (diabetic ketoacidosis) (HCC) -In the setting of medication noncompliance and the use of alcohol -Currently with resolution of acute DKA. -Continue sliding scale insulin, Levemir and NovoLog for meal coverage. -Continue to follow CBGs and adjust hypoglycemic regimen as needed. -A1c pending. -Education about importance of medication compliance provided.  2-alcohol abuse -Currently no active  withdrawal -Cessation counseling provided -Will utilize CIWA protocol, thiamine and folic acid. -Patient entered into pursued rehabilitation. -TOC counseling placed.  3-Elevated MCV -MCV 106.4 -B12 level 433 -Most likely in the setting of alcohol abuse -Continue folic acid   4-Hyponatremia -Pseudohyponatremia in the setting of dehydration and hyperglycemia from DKA. -Alcohol abuse also playing a role -Continue fluid resuscitation and further management of patient's CBGs -Continue to follow electrolytes trend.  5-Transaminitis and Serum total bilirubin elevated -In the setting of alcohol abuse -Continue fluid resuscitation and supportive care -No icterus or jaundice appreciated on examination -LFTs improved and back to normal now. -Alcohol cessation counseling provided. -TOC has been contacted and will see patient to provide rehab/resources information.  6-gastroesophageal reflux disease/epigastric pain -Will concern for gastritis and esophagitis probably triggered by alcohol abuse. -Continue the use of PPI -As needed antiemetics will be available.  7-suicidal plan -Patient expressed to his sister that he is planning to end his life by ensuring himself on the head with a lot of gun that he has at home. -Patient's father has found a loaded gun in the place specified -He denies suicidal ideation to me during examination.  Oriented x3. -Psychiatry service has been notified for clearance -Sitter has been ordered at bedside.  DVT prophylaxis: SCDs Code Status: Full code Family Communication: No family at bedside. Disposition:   Status is: Inpatient  The patient will require care spanning > 2 midnights and should be moved to inpatient because: IV treatments appropriate due to intensity of illness or inability to take PO  Dispo: The patient is from: Home              Anticipated d/c is to:  Home              Patient currently is not medically stable to d/c.   Difficult to  place patient No     Consultants:  Psychiatry service  Procedures:  See below for x-ray reports.  Antimicrobials:  None   Subjective: No active alcohol withdrawal symptoms appreciated currently; no chest pain, no nausea, no vomiting.  Mild hypoglycemic event seen this morning.  Objective: Vitals:   08/17/20 0012 08/17/20 0445 08/17/20 0751 08/17/20 1255  BP: 122/90 119/85 131/82 117/85  Pulse: 94 73 85 95  Resp: 18 18 18 18   Temp: 98.3 F (36.8 C) 97.9 F (36.6 C) 98.5 F (36.9 C) 98.3 F (36.8 C)  TempSrc: Oral Oral Oral Oral  SpO2: 100% 100% 100% 100%  Weight:      Height:        Intake/Output Summary (Last 24 hours) at 08/17/2020 1640 Last data filed at 08/17/2020 0700 Gross per 24 hour  Intake 1201.81 ml  Output --  Net 1201.81 ml   Filed Weights   08/15/20 1539 08/15/20 2024  Weight: 68 kg 62.8 kg    Examination: General exam: No fever, no chest pain, no nausea, no vomiting.  Alert, awake, oriented x 3; mild hypoglycemic event appreciated this morning.  No active withdrawal appreciated today examination. Respiratory system: Clear to auscultation. Respiratory effort normal.  No using accessory muscle. Cardiovascular system: RRR, no rubs, no gallops, no JVD. Gastrointestinal system: Abdomen is nondistended, soft and nontender. No organomegaly or masses felt. Normal bowel sounds heard. Central nervous system: Alert and oriented. No focal neurological deficits. Extremities: No cyanosis or clubbing. Skin: No rashes, no petechiae. Psychiatry: Flat affect; reports no suicidal ideation or hallucinations to me; once discussing patient's updates with his sister she expressed the patient today told her that we will below of his head with a loaded gun that he has at home; patient's father went to the location specified on the discussion with the sister and found a loaded gun.   Data Reviewed: I have personally reviewed following labs and imaging studies  CBC: Recent  Labs  Lab 08/15/20 1602 08/16/20 1220  WBC 14.4* 6.4  NEUTROABS 12.1*  --   HGB 14.2 12.3*  HCT 41.3 34.3*  MCV 106.4* 102.1*  PLT 238 174    Basic Metabolic Panel: Recent Labs  Lab 08/15/20 1952 08/16/20 0133 08/16/20 0532 08/16/20 0908 08/16/20 1220  NA 131* 131* 134* 133* 134*  K 4.4 3.5 3.7 3.3* 3.4*  CL 99 101 102 101 101  CO2 13* 20* 22 22 27   GLUCOSE 126* 137* 149* 246* 180*  BUN 12 9 8 7 7   CREATININE 1.03 0.83 0.81 0.81 0.76  CALCIUM 8.2* 8.1* 8.4* 8.3* 8.3*  MG  --   --   --   --  1.8  PHOS  --   --   --   --  1.2*    GFR: Estimated Creatinine Clearance: 110.1 mL/min (by C-G formula based on SCr of 0.76 mg/dL).  Liver Function Tests: Recent Labs  Lab 08/15/20 1602 08/16/20 1220  AST 30 21  ALT 50* 30  ALKPHOS 135* 99  BILITOT 2.2* 0.8  PROT 8.0 5.7*  ALBUMIN 4.5 3.3*    CBG: Recent Labs  Lab 08/17/20 0829 08/17/20 1001 08/17/20 1106 08/17/20 1529 08/17/20 1611  GLUCAP 232* 281* 200* 129* 111*    Recent Results (from the past 240 hour(s))  Resp Panel by RT-PCR (Flu A&B, Covid)  Nasopharyngeal Swab     Status: None   Collection Time: 08/15/20  4:03 PM   Specimen: Nasopharyngeal Swab; Nasopharyngeal(NP) swabs in vial transport medium  Result Value Ref Range Status   SARS Coronavirus 2 by RT PCR NEGATIVE NEGATIVE Final    Comment: (NOTE) SARS-CoV-2 target nucleic acids are NOT DETECTED.  The SARS-CoV-2 RNA is generally detectable in upper respiratory specimens during the acute phase of infection. The lowest concentration of SARS-CoV-2 viral copies this assay can detect is 138 copies/mL. A negative result does not preclude SARS-Cov-2 infection and should not be used as the sole basis for treatment or other patient management decisions. A negative result may occur with  improper specimen collection/handling, submission of specimen other than nasopharyngeal swab, presence of viral mutation(s) within the areas targeted by this assay, and  inadequate number of viral copies(<138 copies/mL). A negative result must be combined with clinical observations, patient history, and epidemiological information. The expected result is Negative.  Fact Sheet for Patients:  BloggerCourse.com  Fact Sheet for Healthcare Providers:  SeriousBroker.it  This test is no t yet approved or cleared by the [place] FDA and  has been authorized for detection and/or diagnosis of SARS-CoV-2 by FDA under an Emergency Use Authorization (EUA). This EUA will remain  in effect (meaning this test can be used) for the duration of the COVID-19 declaration under Section 564(b)(1) of the Act, 21 U.S.C.section 360bbb-3(b)(1), unless the authorization is terminated  or revoked sooner.       Influenza A by PCR NEGATIVE NEGATIVE Final   Influenza B by PCR NEGATIVE NEGATIVE Final    Comment: (NOTE) The Xpert Xpress SARS-CoV-2/FLU/RSV plus assay is intended as an aid in the diagnosis of influenza from Nasopharyngeal swab specimens and should not be used as a sole basis for treatment. Nasal washings and aspirates are unacceptable for Xpert Xpress SARS-CoV-2/FLU/RSV testing.  Fact Sheet for Patients: BloggerCourse.com  Fact Sheet for Healthcare Providers: SeriousBroker.it  This test is not yet approved or cleared by the [place] FDA and has been authorized for detection and/or diagnosis of SARS-CoV-2 by FDA under an Emergency Use Authorization (EUA). This EUA will remain in effect (meaning this test can be used) for the duration of the COVID-19 declaration under Section 564(b)(1) of the Act, 21 U.S.C. section 360bbb-3(b)(1), unless the authorization is terminated or revoked.  Performed at [facility], [address] [place], [place] [address]   MRSA Next Gen by PCR, Nasal     Status: None   Collection Time: 08/15/20  8:40 PM   Specimen: Nasal  Mucosa; Nasal Swab  Result Value Ref Range Status   MRSA by PCR Next Gen NOT DETECTED NOT DETECTED Final    Comment: (NOTE) The GeneXpert MRSA Assay (FDA approved for NASAL specimens only), is one component of a comprehensive MRSA colonization surveillance program. It is not intended to diagnose MRSA infection nor to guide or monitor treatment for MRSA infections. Test performance is not FDA approved in patients less than 60 years old. Performed at [facility], [address] [place], [place] [address]      Radiology Studies: [facility] Chest [facility] 1 View  Result Date: 08/15/2020 CLINICAL DATA:  Fever, nausea and vomiting EXAM: PORTABLE CHEST 1 VIEW COMPARISON:  01/21/2020 chest radiograph. FINDINGS: Stable cardiomediastinal silhouette with normal heart size. No pneumothorax. No pleural effusion. Lungs appear clear, with no acute consolidative airspace disease and no pulmonary edema. IMPRESSION: No active disease. Electronically Signed   By: Delbert Phenix  M.D.   On: 08/15/2020 16:53     Scheduled Meds:  Chlorhexidine Gluconate Cloth  6 each Topical Q0600   enoxaparin (LOVENOX) injection  40 mg Subcutaneous Q24H   folic acid  1 mg Oral Daily   insulin aspart  0-15 Units Subcutaneous TID WC   insulin aspart  0-5 Units Subcutaneous QHS   insulin aspart  3 Units Subcutaneous TID WC   insulin detemir  10 Units Subcutaneous BID   multivitamin with minerals  1 tablet Oral Daily   pantoprazole  40 mg Oral Daily   thiamine  100 mg Oral Daily   Or   thiamine  100 mg Intravenous Daily   Continuous Infusions:  lactated ringers 100 mL/hr at 08/17/20 1527     LOS: 1 day    Time spent: 30 minutes.   Vassie Loll, MD [facility]   To contact the attending provider between 7A-7P or the covering provider during after hours 7P-7A, please log into the web site [email or web address] and access using universal [place] password for that web site. If you do not have the password, please call the  hospital operator.  08/17/2020, 4:40 PM

## 2020-08-17 NOTE — Progress Notes (Signed)
Patient and belongings wanded by security.  

## 2020-08-17 NOTE — Progress Notes (Signed)
Inpatient Diabetes Program Recommendations  AACE/ADA: New Consensus Statement on Inpatient Glycemic Control   Target Ranges:  Prepandial:   less than 140 mg/dL      Peak postprandial:   less than 180 mg/dL (1-2 hours)      Critically ill patients:  140 - 180 mg/dL  Results for Tommy Martinez, Tommy Martinez (MRN [id number]) as of 08/17/2020 07:41  Ref. Range 08/16/2020 08:57 08/16/2020 09:58 08/16/2020 11:12 08/16/2020 16:04 08/16/2020 20:08 08/17/2020 00:17 08/17/2020 00:51 08/17/2020 07:14  Glucose-Capillary Latest Ref Range: 70 - 99 mg/dL 829 (H) 562 (H)     Levemir 13 units@9 :21 217 (H)  Novolog 9 units 141 (H)  Novolog 6 units 234 (H)  Novolog 2 units@22 :21  Levemir 13 units@22 :21 38 (LL) 100 (H) 62 (L)  Results for Tommy Martinez, Tommy Martinez (MRN [id number]) as of 08/17/2020 07:41  Ref. Range 08/15/2020 16:02  Beta-Hydroxybutyric Acid Latest Ref Range: 0.05 - 0.27 mmol/L >8.00 (H)  Glucose Latest Ref Range: 70 - 99 mg/dL 97   Review of Glycemic Control  Diabetes history: DM1; makes NO insulin; requires basal, correction, and carb coverage insulin Outpatient Diabetes medications: NPH 15 units BID, Regular 5-10 units TID Current orders for Inpatient glycemic control: Levemir 10 units BID, Novolog 3 units TID with meals, Novolog 0-15 units TID with meals, Novolog 0-5 units QHS   Inpatient Diabetes Program Recommendations:    Insulin: Noted Levemir decreased from 13 units to 10 units BID and meal coverage from Novolog 4 units to 3 units. Please consider decreasing Novolog correction to 0-9 units TID with meals.  HbgA1C: Current A1C in process.  NOTE: Per H&P,  "Patient states that he has not been very compliant with his medication, he states that he took his insulin this morning, though he did not take it yesterday." Initial glucose 97 and beta-hydroxybutyric acid >8.0, CO2 9,AG 25 on 08/15/20 and patient was started on IV insulin for DKA and transitioned to SQ insulin yesterday morning. Spoke with patient over the phone  regarding DM management. Patient admits that recently he has not been taking insulin consistently. Patient is getting insulin over the counter at [facility] and using NPH and Regular. Patient states the dose of NPH and Regular vary depending on glucose control. Patient states he usually takes NPH 15 units BID and Regular TID. Patient does not have any specific criteria he uses for carb coverage or correction. Explained that a person with DM1 should have some specific parameters to use for carb coverage and correction. Inquired about who is helping him manage his DM. Patient states "I wing it pretty much."  Patient states that he use to see several Endocrinologist in the past but has not seen one in a long time. Inquired about prior A1C and patient stated he did not remember his last A1C. Informed patient that an A1C has been ordered but not resulted. Discussed glucose and A1C goals. Discussed importance of checking CBGs and maintaining good CBG control to prevent long-term and short-term complications. Explained how hyperglycemia leads to damage within blood vessels which lead to the common complications seen with uncontrolled diabetes. Stressed to the patient the importance of improving glycemic control to prevent further complications from uncontrolled diabetes. Encouraged patient to consider getting re-established with an Endocrinologist. Informed patient that there was an Actor in [place] (Dr. Fransico Him) if he was interested in getting established with a local Endocrinologist. Encouraged patient to work toward getting DM under better control especially given he is 40 years old and he is at high risk  of complications from uncontrolled DM. Patient states he has everything he needs for DM management.   Patient verbalized understanding of information discussed and reports no further questions at this time related to diabetes.  Thanks, Orlando Penner, RN, MSN, CDE Diabetes Coordinator Inpatient Diabetes  Program [phone] (Team Pager from 8am to 5pm)

## 2020-08-17 NOTE — Progress Notes (Signed)
At 1510 patients sister called this nurse stating the patient had been texting her with suicidal   ideations.                                                                                      The sister stated the patient text said he was feeling anxiety, he was upset about not being discharged today, the sister informed this nurse that the patient had a loaded gun beside his bed at home. The patients sister also stated that the patient told the sister in text message that he said  "Fuck it ill take care of it myself" and the sister stated that he is implying ending his life.   The patient has not verbally  stated suicidal ideation to this nurse. MD made aware of situation. This nurse gave MD the  sister name and number.

## 2020-08-17 NOTE — TOC Initial Note (Signed)
Transition of Care [facility]) - Initial/Assessment Note    Patient Details  Name: Tommy Martinez MRN: [id number] Date of Birth: 1980/05/03  [facility] CM/SW Contact:    Barry Brunner, LCSW Phone Number: 08/17/2020, 4:58 PM  Clinical Narrative:                 Patient is a 40 year old male admitted for DKA (diabetic ketoacidosis). CSW notified of patient's request for inpatient rehabs list and possible need for shelters. CSW provided patient with SA resources, local shelter list and county shelter list. CSW also highlighted the [facility] where he could live and participate in out patient rehab if appropriate. [facility] TOC to follow.  Expected Discharge Plan: Home/Self Care Barriers to Discharge: Continued Medical Work up   Patient Goals and CMS Choice Patient states their goals for this hospitalization and ongoing recovery are:: Return home CMS Medicare.gov Compare Post Acute Care list provided to:: Patient Choice offered to / list presented to : Patient  Expected Discharge Plan and Services Expected Discharge Plan: Home/Self Care       Living arrangements for the past 2 months: Single Family Home                                      Prior Living Arrangements/Services Living arrangements for the past 2 months: Single Family Home Lives with:: Self, Siblings Patient language and need for interpreter reviewed:: Yes        Need for Family Participation in Patient Care: Yes (Comment) Care giver support system in place?: Yes (comment)   Criminal Activity/Legal Involvement Pertinent to Current Situation/Hospitalization: No - Comment as needed  Activities of Daily Living Home Assistive Devices/Equipment: None ADL Screening (condition at time of admission) Patient's cognitive ability adequate to safely complete daily activities?: Yes Is the patient deaf or have difficulty hearing?: No Does the  patient have difficulty seeing, even when wearing glasses/contacts?: No Does the patient have difficulty concentrating, remembering, or making decisions?: Yes Patient able to express need for assistance with ADLs?: No Does the patient have difficulty dressing or bathing?: No Independently performs ADLs?: Yes (appropriate for developmental age) Does the patient have difficulty walking or climbing stairs?: No Weakness of Legs: Both Weakness of Arms/Hands: None  Permission Sought/Granted Permission sought to share information with : Family Supports Permission granted to share information with : Yes, Verbal Permission Granted              Emotional Assessment     Affect (typically observed): Accepting Orientation: : Oriented to Self, Oriented to Situation, Oriented to Place, Oriented to  Time Alcohol / Substance Use: Alcohol Use Psych Involvement: No (comment)  Admission diagnosis:  SOB (shortness of breath) [R06.02] DKA (diabetic ketoacidosis) (HCC) [E11.10] Diabetic ketoacidosis without coma associated with type 1 diabetes mellitus (HCC) [E10.10] DKA, type 1 (HCC) [E10.10] Patient Active Problem List   Diagnosis Date Noted   DKA, type 1 (HCC) 08/16/2020   DKA (diabetic ketoacidosis) (HCC) 08/15/2020   Elevated lipase 08/15/2020   Hyponatremia 08/15/2020   Transaminitis 08/15/2020   Serum total bilirubin elevated 08/15/2020   Drug abuse (HCC) 08/15/2020   Leukocytosis 08/15/2020   Elevated MCV 08/15/2020   Alcohol abuse 08/15/2020   H/O medication noncompliance 08/15/2020   Nausea & vomiting 08/15/2020   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 [place] - [address] [place] [address] [place] [address] [place] [address] Phone: [phone] Fax: [phone]     Social Determinants of Health (SDOH) Interventions    Readmission Risk  Interventions No flowsheet data found.

## 2020-08-17 NOTE — BH Assessment (Signed)
Comprehensive Clinical Assessment (CCA) Note  08/17/2020 Tommy Martinez [id number]  DISPOSITION: Gave clinical report to Tommy Asper, NP who determined Pt meets criteria for inpatient psychiatric treatment when medically cleared. Facilities will be contacted for placement. Notified Dr Tommy Martinez and Tommy Bonito, RN via secure chat of recommendation.  The patient demonstrates the following risk factors for suicide: Chronic risk factors for suicide include: substance use disorder. Acute risk factors for suicide include: family or marital conflict, unemployment, social withdrawal/isolation, and loss (financial, interpersonal, professional). Protective factors for this patient include: responsibility to others (children, family). Considering these factors, the overall suicide risk at this point appears to be high. Patient is not appropriate for outpatient follow up.  Flowsheet Row ED to Hosp-Admission (Current) from 08/15/2020 in [facility]  C-SSRS RISK CATEGORY High Risk      Pt is a 40 year old separated male who presents unaccompanied to [facility] due to diabetic ketoacidosis, alcohol use, and other medical concerns. Pt reports he was "partying heavily" for the past 2-3 weeks and was not taking caring for his diabetes. He reports drinking approximately a half a fifth of liquor daily. He describes experiencing alcohol withdrawal when he stops drinking including tremors, nausea, and "seeing bugs." Pt describes his mood as severely depressed. Pt acknowledges symptoms including crying spells, social withdrawal, loss of interest in usual pleasures, fatigue, irritability, decreased concentration, decreased sleep, decreased appetite and feelings of guilt, worthlessness and hopelessness. He reports sleeping 2-3 hour a night and wakes to drink alcohol. He says he has not been eating regularly. He reports current suicidal ideation with a plan to "blow my brains out." Pt says he did  have a gun but his family has secured it. He denies any history of suicide attempts. Pt denies any history of intentional self-injurious behaviors. Pt denies current homicidal ideation or history of violence. Pt denies currently experiencing auditory or visual hallucinations. Pt reports he occasionally uses marijuana and cocaine and denies use of other drugs. Pt's urine drug screen was positive for cannabis.  Pt identifies several stressors. He says he works in [employer] but is unemployed. He says he cannot pay his bills and is currently homeless. He has chronic medical problem of diabetes. He says he and his wife have been separated for a year. They have two children, ages 52 and 33, and Pt says he never sees them. He identifies his sister and father as supportive "but not when I am drinking." He denies history of abuse. He denies legal problems. He denies any history of inpatient or outpatient mental health or substance abuse treatment.  Pt is covered by a blanket, alert and oriented x4. Pt speaks in a clear tone, at moderate volume and normal pace. Motor behavior appears normal. Eye contact is good. Pt's mood is depressed and affect is depressed and anxious. Thought process is coherent and relevant. There is no indication Pt is currently responding to internal stimuli or experiencing delusional thought content. Pt was cooperative throughout assessment. He says he is willing to sign voluntarily into a psychiatric facility.  Chief Complaint:  Chief Complaint  Patient presents with   Shortness of Breath   Visit Diagnosis: F33.2 Major depressive disorder, Recurrent episode, Severe F10.20 Alcohol use disorder, Severe   CCA Screening, Triage and Referral (STR)  Patient Reported Information How did you hear about [place]? Self  Referral name: No data recorded Referral phone number: No data recorded  Whom do you see for routine medical problems?  No data recorded Practice/Facility Name: No data  recorded Practice/Facility Phone Number: No data recorded Name of Contact: No data recorded Contact Number: No data recorded Contact Fax Number: No data recorded Prescriber Name: No data recorded Prescriber Address (if known): No data recorded  What Is the Reason for Your Visit/Call Today? Pt admitted to medical unit due to DKA and alcohol use. Pt reports severe depressive symptoms and suicidal ideation with plan to shoot himself.  How Long Has This Been Causing You Problems? 1-6 months  What Do You Feel Would Help You the Most Today? Alcohol or Drug Use Treatment; Treatment for Depression or other mood problem   Have You Recently Been in Any Inpatient Treatment (Hospital/Detox/Crisis Center/28-Day Program)? No data recorded Name/Location of Program/Hospital:No data recorded How Long Were You There? No data recorded When Were You Discharged? No data recorded  Have You Ever Received Services From [facility] Before? No data recorded Who Do You See at [facility]? No data recorded  Have You Recently Had Any Thoughts About Hurting Yourself? Yes  Are You Planning to Commit Suicide/Harm Yourself At This time? Yes   Have you Recently Had Thoughts About Hurting Someone [name]? No  Explanation: No data recorded  Have You Used Any Alcohol or Drugs in the Past 24 Hours? No (Due to being admitted to medical unit)  How Long Ago Did You Use Drugs or Alcohol? No data recorded What Did You Use and How Much? No data recorded  Do You Currently Have a Therapist/Psychiatrist? No  Name of Therapist/Psychiatrist: No data recorded  Have You Been Recently Discharged From Any Office Practice or Programs? No  Explanation of Discharge From Practice/Program: No data recorded    CCA Screening Triage Referral Assessment Type of Contact: Tele-Assessment  Is this Initial or Reassessment? Initial Assessment  Date Telepsych consult ordered in [facility]:  08/17/20  [facility]   [facility]   Patient Reported Information Reviewed? No data recorded Patient Left Without Being Seen? No data recorded Reason for Not Completing Assessment: No data recorded  Collateral Involvement: None   Does Patient Have a Court Appointed Legal Guardian? No data recorded Name and Contact of Legal Guardian: No data recorded If Minor and Not Living with Parent(s), Who has Custody? NA  Is CPS involved or ever been involved? Never  Is APS involved or ever been involved? Never   Patient Determined To Be At Risk for Harm To Self or Others Based on Review of Patient Reported Information or Presenting Complaint? Yes, for Self-Harm  Method: No data recorded Availability of Means: No data recorded Intent: No data recorded Notification Required: No data recorded Additional Information for Danger to Others Potential: No data recorded Additional Comments for Danger to Others Potential: No data recorded Are There Guns or Other Weapons in Your Home? No data recorded Types of Guns/Weapons: No data recorded Are These Weapons Safely Secured?                            No data recorded Who Could Verify You Are Able To Have These Secured: No data recorded Do You Have any Outstanding Charges, Pending Court Dates, Parole/Probation? No data recorded Contacted To Inform of Risk of Harm To Self or Others: Unable to Contact:   [facility]   Does Patient Present under Involuntary Commitment? No  IVC Papers Initial File Date: No data recorded  [place]   Patient Currently Receiving the Following Services: Not Receiving Services   Determination of Need: Emergent (2 hours)   Options For Referral: Inpatient Hospitalization     CCA Biopsychosocial Intake/Chief Complaint:  No data recorded Current Symptoms/Problems: No data recorded  Patient Reported Schizophrenia/Schizoaffective Diagnosis in Past: No   Strengths: Pt reports he  has family support.  Preferences: No data recorded Abilities: No data recorded  Type of Services Patient Feels are Needed: No data recorded  Initial Clinical Notes/Concerns: No data recorded  Mental Health Symptoms Depression:   Change in energy/activity; Difficulty Concentrating; Fatigue; Hopelessness; Increase/decrease in appetite; Irritability; Sleep (too much or little); Tearfulness; Weight gain/loss; Worthlessness   Duration of Depressive symptoms:  Greater than two weeks   Mania:   None   Anxiety:    Worrying; Tension; Sleep; Restlessness; Irritability; Fatigue; Difficulty concentrating   Psychosis:   None   Duration of Psychotic symptoms: No data recorded  Trauma:   None   Obsessions:   None   Compulsions:   None   Inattention:   N/A   Hyperactivity/Impulsivity:   N/A   Oppositional/Defiant Behaviors:   N/A   Emotional Irregularity:   None   Other Mood/Personality Symptoms:   None    Mental Status Exam Appearance and self-care  Stature:   Average   Weight:   Average weight   Clothing:   -- (Covered by blanket)   Grooming:   Normal   Cosmetic use:   None   Posture/gait:   Normal   Motor activity:   Not Remarkable   Sensorium  Attention:   Normal   Concentration:   Anxiety interferes   Orientation:   X5   Recall/memory:   Normal   Affect and Mood  Affect:   Anxious; Depressed   Mood:   Depressed   Relating  Eye contact:   Normal   Facial expression:   Depressed   Attitude toward examiner:   Cooperative   Thought and Language  Speech flow:  Normal   Thought content:   Appropriate to Mood and Circumstances   Preoccupation:   None   Hallucinations:   None   Organization:  No data recorded  [employer] of Knowledge:   Average   Intelligence:   Average   Abstraction:   Normal   Judgement:   Impaired   Reality Testing:   Adequate   Insight:   Gaps   Decision Making:    Normal   Social Functioning  Social Maturity:   Isolates   Social Judgement:   Normal   Stress  Stressors:   Family conflict; Relationship; Housing; Surveyor, quantity; Work   Coping Ability:   Human resources officer Deficits:   None   Supports:   Family     Religion: Religion/Spirituality Are You A Religious Person?: No How Might This Affect Treatment?: NA  Leisure/Recreation: Leisure / Recreation Do You Have Hobbies?: No  Exercise/Diet: Exercise/Diet Do You Exercise?: No Have You Gained or Lost A Significant Amount of Weight in the Past Six Months?: No Do You Follow a Special Diet?: Yes Type of Diet: Diabetic Do You Have Any Trouble Sleeping?: Yes Explanation of Sleeping Difficulties: Pt reports sleeping 2-3 hours per night.   CCA Employment/Education Employment/Work Situation: Employment / Work Situation Employment Situation: Unemployed Patient's Job has Been Impacted by Current Illness: No Has Patient ever Been in Equities trader?: No  Education: Education Is Patient Currently Attending School?: No Last Grade Completed:  (  GED) Did You Attend College?: No Did You Have An Individualized Education Program (IIEP): No Did You Have Any Difficulty At School?: No Patient's Education Has Been Impacted by Current Illness: No   CCA Family/Childhood History Family and Relationship History: Family history Marital status: Separated Separated, when?: 1 year ago Does patient have children?: Yes How many children?: 2 How is patient's relationship with their children?: Pt states he has two children, age 39 and 60, that he does not see.  Childhood History:  Childhood History By whom was/is the patient raised?: Grandparents Did patient suffer any verbal/emotional/physical/sexual abuse as a child?: No Did patient suffer from severe childhood neglect?: No Has patient ever been sexually abused/assaulted/raped as an adolescent or adult?: No Was the patient ever a victim of a  crime or a disaster?: No Witnessed domestic violence?: No Has patient been affected by domestic violence as an adult?: No  Child/Adolescent Assessment:     CCA Substance Use Alcohol/Drug Use: Alcohol / Drug Use Pain Medications: Denies abuse Prescriptions: Denies abuse Over the Counter: Denies abuse History of alcohol / drug use?: Yes Longest period of sobriety (when/how long): Unknown Negative Consequences of Use: Personal relationships, Financial, Work / School Withdrawal Symptoms: Blackouts, Nausea / Vomiting, Tremors, DTs Substance #1 Name of Substance 1: Alcohol 1 - Age of First Use: 15 1 - Amount (size/oz): Approximately half a fifth of liquor 1 - Frequency: Daily 1 - Duration: Ongoing 1 - Last Use / Amount: 08/13/2020 1 - Method of Aquiring: Store 1- Route of Use: Oral Substance #2 Name of Substance 2: Marijuana 2 - Age of First Use: 15 2 - Amount (size/oz): "A small amount' 2 - Frequency: Not often, just when people offer it 2 - Duration: Ongoing 2 - Last Use / Amount: 08/13/2020 2 - Method of Aquiring: Friends 2 - Route of Substance Use: Smoking Substance #3 Name of Substance 3: Cocaine 3 - Age of First Use: unknown 3 - Amount (size/oz): varies 3 - Frequency: "not often" 3 - Duration: Ongoing 3 - Last Use / Amount: unknown 3 - Method of Aquiring: Friends 3 - Route of Substance Use: inhale                   ASAM's:  Six Dimensions of Multidimensional Assessment  Dimension 1:  Acute Intoxication and/or Withdrawal Potential:   Dimension 1:  Description of individual's past and current experiences of substance use and withdrawal: Pt reports drinking alcohol daily and experiencing alcohol withdrawal  Dimension 2:  Biomedical Conditions and Complications:   Dimension 2:  Description of patient's biomedical conditions and  complications: Diabetes  Dimension 3:  Emotional, Behavioral, or Cognitive Conditions and Complications:  Dimension 3:  Description  of emotional, behavioral, or cognitive conditions and complications: Pt describes feeling depressed and suicidal  Dimension 4:  Readiness to Change:  Dimension 4:  Description of Readiness to Change criteria: Pt is in contemplation stage  Dimension 5:  Relapse, Continued use, or Continued Problem Potential:  Dimension 5:  Relapse, continued use, or continued problem potential critiera description: Pt has short periods of sobriety  Dimension 6:  Recovery/Living Environment:  Dimension 6:  Recovery/Iiving environment criteria description: Pt reports he is homeless  ASAM Severity Score: ASAM's Severity Rating Score: 14  ASAM Recommended Level of Treatment: ASAM Recommended Level of Treatment: Level III Residential Treatment   Substance use Disorder (SUD) Substance Use Disorder (SUD)  Checklist Symptoms of Substance Use: Continued use despite having a persistent/recurrent physical/psychological problem caused/exacerbated  by use, Continued use despite persistent or recurrent social, interpersonal problems, caused or exacerbated by use, Evidence of tolerance, Evidence of withdrawal (Comment), Large amounts of time spent to obtain, use or recover from the substance(s), Persistent desire or unsuccessful efforts to cut down or control use, Presence of craving or strong urge to use, Recurrent use that results in a failure to fulfill major role obligations (work, school, home), Social, occupational, recreational activities given up or reduced due to use, Substance(s) often taken in larger amounts or over longer times than was intended  Recommendations for Services/Supports/Treatments: Recommendations for Services/Supports/Treatments Recommendations For Services/Supports/Treatments: Inpatient Hospitalization  DSM5 Diagnoses: Patient Active Problem List   Diagnosis Date Noted   DKA, type 1 (HCC) 08/16/2020   DKA (diabetic ketoacidosis) (HCC) 08/15/2020   Elevated lipase 08/15/2020   Hyponatremia 08/15/2020    Transaminitis 08/15/2020   Serum total bilirubin elevated 08/15/2020   Drug abuse (HCC) 08/15/2020   Leukocytosis 08/15/2020   Elevated MCV 08/15/2020   Alcohol abuse 08/15/2020   H/O medication noncompliance 08/15/2020   Nausea & vomiting 08/15/2020   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

## 2020-08-18 DIAGNOSIS — R45851 Suicidal ideations: Secondary | ICD-10-CM

## 2020-08-18 LAB — BASIC METABOLIC PANEL
Anion gap: 7 (ref 5–15)
BUN: 8 mg/dL (ref 6–20)
CO2: 29 mmol/L (ref 22–32)
Calcium: 9.2 mg/dL (ref 8.9–10.3)
Chloride: 101 mmol/L (ref 98–111)
Creatinine, Ser: 0.57 mg/dL — ABNORMAL LOW (ref 0.61–1.24)
GFR, Estimated: >60 mL/min (ref >60)
Glucose, Bld: 249 mg/dL — ABNORMAL HIGH (ref 70–99)
Potassium: 4.1 mmol/L (ref 3.5–5.1)
Sodium: 137 mmol/L (ref 135–145)

## 2020-08-18 LAB — GLUCOSE, CAPILLARY
Glucose-Capillary: 202 mg/dL — ABNORMAL HIGH (ref 70–99)
Glucose-Capillary: 166 mg/dL — ABNORMAL HIGH (ref 70–99)

## 2020-08-18 MED ORDER — FOLIC ACID 1 MG PO TABS: 1.0000 mg | ORAL_TABLET | Freq: Every day | ORAL | Status: AC

## 2020-08-18 MED ORDER — INSULIN LISPRO 100 UNIT/ML IJ SOLN: 5.0000 [IU] | Freq: Three times a day (TID) | INTRAMUSCULAR | 11 refills | Status: AC

## 2020-08-18 MED ORDER — THIAMINE HCL 100 MG PO TABS: 100.0000 mg | ORAL_TABLET | Freq: Every day | ORAL | Status: AC

## 2020-08-18 MED ORDER — INSULIN DETEMIR 100 UNIT/ML ~~LOC~~ SOLN: 12.0000 [IU] | Freq: Two times a day (BID) | SUBCUTANEOUS | 11 refills | Status: AC

## 2020-08-18 MED ORDER — PANTOPRAZOLE SODIUM 40 MG PO TBEC: 40.0000 mg | DELAYED_RELEASE_TABLET | Freq: Every day | ORAL | 1 refills | Status: AC

## 2020-08-18 NOTE — Discharge Summary (Signed)
Physician Discharge Summary  Tommy Martinez JOA:[id number] DOB: [birth year] DOA: 08/15/2020  PCP: Assunta Found, MD  Admit date: 08/15/2020 Discharge date: 08/18/2020  Time spent: 35 minutes  Recommendations for Outpatient Follow-up:  Close monitoring to patient CBG with further adjustment to hypoglycemia regimen as needed Repeat basic metabolic panel to follow electrolytes and renal function Repeat liver function test to follow enzymes and stability Psychiatry treatment as per psychiatry service at receiving facility. Continue alcohol cessation counseling and assistant with quitting.   Discharge Diagnoses:  Principal Problem:   DKA (diabetic ketoacidosis) (HCC) Active Problems:   Elevated lipase   Hyponatremia   Transaminitis   Serum total bilirubin elevated   Drug abuse (HCC)   Leukocytosis   Elevated MCV   Alcohol abuse   H/O medication noncompliance   Nausea & vomiting   DKA, type 1 (HCC)   Discharge Condition: Medically stable and in agreement to pursuit psychiatry rehabilitation.  CODE STATUS: Full code  Diet recommendation: Modified carbohydrate diet.  Filed Weights   08/15/20 1539 08/15/20 2024  Weight: 68 kg 62.8 kg    History of present illness:   As per H&P written by Dr. Thomes Dinning on 08/15/2020 Tommy Martinez is a 40 y.o. male with medical history significant for type I DM, noncompliance with medication regimen, alcohol abuse, tobacco and recreational drug use who presents to the emergency department due to 2-day onset of abdominal pain associated with nausea and nonbloody vomiting with difficulty in being able to tolerate any oral intake.  Patient complains of nonradiating, persistent sharp epigastric pain of moderate intensity with no known alleviating/aggravating factor.  Patient states that he has not been very compliant with his medication, he states that he took his insulin this morning, though he did not take it yesterday.  He complains of thirst, excessive  urination with no burning sensation or any other irritative bladder symptoms on urination.  Patient states that he drinks 1/5 of hard liquor and denies withdrawal symptoms or seizures due to alcohol withdrawal. he states that he has not drank any alcohol since past 2 days.  He states that he smokes 1 pack of cigarettes daily for many years and endorsed use of marijuana occasionally.  Patient complained of shortness of breath on exertion which started few hours PTA.  He denies nausea, vomiting, headache, chest pain or diarrhea. Recent hospitalization: 2/13-2/15 due to DKA  Hospital Course:  1-DKA (diabetic ketoacidosis) (HCC) -In the setting of medication noncompliance and the use of alcohol -Currently with resolution of acute DKA. -Continue long-acting insulin twice a day along with 3 times a day meal coverage of rapid insulin. -Continue to follow CBGs and adjust hypoglycemic regimen as needed. -A1c 9.2 -Education about importance of medication compliance and not skipping diet provided.   2-alcohol abuse -Cessation counseling provided -CIWA protocol for supervision instituted -No withdrawal symptoms appreciated -Continue thiamine and folic acid.   3-Elevated MCV -MCV 106.4 -B12 level 433 -Most likely in the setting of alcohol abuse -Continue folic acid   4-Hyponatremia -Pseudohyponatremia in the setting of dehydration and hyperglycemia from DKA. -Alcohol abuse also playing a role -Continue to maintain adequate nutrition and hydration; continue appropriate management of CBGs. -Repeat basic metabolic panel to follow electrolytes trend and instability.   5-Transaminitis and Serum total bilirubin elevated -In the setting of alcohol abuse -Continue to maintain adequate hydration. -No icterus or jaundice appreciated on examination -LFTs improved and back to normal at time of discharge. -Alcohol cessation counseling provided.   6-gastroesophageal reflux disease/epigastric pain -  With  concern for gastritis and esophagitis probably triggered by alcohol abuse. -Continue the use of PPI -Continue as needed antiemetics.   7-suicidal plan -Patient expressed to his sister that he is planning to end his life by ensuring himself on the head with a lot of gun that he has at home. -Patient's father has found a loaded gun in the place specified -Patient is calm and voluntarily in agreement to go to psychiatric facility -Appreciate assistance by psychiatry service; patient will go to [facility] for further evaluation and management.  Procedures: See below for x-ray reports.  Consultations: Psychiatry service Social worker  Discharge Exam: Vitals:   08/17/20 2032 08/18/20 0616  BP: (!) 129/94 117/86  Pulse: 77 71  Resp: 19 16  Temp: 98.2 F (36.8 C) 98 F (36.7 C)  SpO2: 100% 100%    General: Calm, no chest pain, no nausea, no vomiting. Cardiovascular: Rate controlled, no rubs, no gallops. Respiratory: Clear to auscultation bilaterally; no using accessory muscle.  No requiring oxygen supplementation. Abdomen: Soft, nontender, nondistended, positive bowel sounds Extremities: No cyanosis or clubbing.  Discharge Instructions   Discharge Instructions     Diet Carb Modified   Complete by: As directed    Discharge instructions   Complete by: As directed    -Check patient CBG at least 4 times a day -Follow modified carbohydrate diet -Maintain adequate hydration      Allergies as of 08/18/2020   No Known Allergies      Medication List     STOP taking these medications    HYDROcodone-acetaminophen 5-325 MG tablet Commonly known as: NORCO/VICODIN   insulin NPH Human 100 UNIT/ML injection Commonly known as: NOVOLIN N   insulin regular 100 units/mL injection Commonly known as: NOVOLIN R   phenol 1.4 % Liqd Commonly known as: CHLORASEPTIC       TAKE these medications    folic acid 1 MG tablet Commonly known as: FOLVITE Take 1 tablet (1 mg total)  by mouth daily. Start taking on: August 19, 2020   insulin detemir 100 UNIT/ML injection Commonly known as: LEVEMIR Inject 0.12 mLs (12 Units total) into the skin 2 (two) times daily.   insulin lispro 100 UNIT/ML injection Commonly known as: HumaLOG Inject 0.05 mLs (5 Units total) into the skin 3 (three) times daily with meals.   pantoprazole 40 MG tablet Commonly known as: PROTONIX Take 1 tablet (40 mg total) by mouth daily. Start taking on: August 19, 2020   thiamine 100 MG tablet Take 1 tablet (100 mg total) by mouth daily. Start taking on: August 19, 2020       No Known Allergies    The results of significant diagnostics from this hospitalization (including imaging, microbiology, ancillary and laboratory) are listed below for reference.    Significant Diagnostic Studies: DG Chest Port 1 View  Result Date: 08/15/2020 CLINICAL DATA:  Fever, nausea and vomiting EXAM: PORTABLE CHEST 1 VIEW COMPARISON:  01/21/2020 chest radiograph. FINDINGS: Stable cardiomediastinal silhouette with normal heart size. No pneumothorax. No pleural effusion. Lungs appear clear, with no acute consolidative airspace disease and no pulmonary edema. IMPRESSION: No active disease. Electronically Signed   By: Delbert PhenixJason A Poff M.D.   On: 08/15/2020 16:53    Microbiology: Recent Results (from the past 240 hour(s))  Resp Panel by RT-PCR (Flu A&B, Covid) Nasopharyngeal Swab     Status: None   Collection Time: 08/15/20  4:03 PM   Specimen: Nasopharyngeal Swab; Nasopharyngeal(NP) swabs in vial transport medium  Result Value Ref Range Status   SARS Coronavirus 2 by RT PCR NEGATIVE NEGATIVE Final    Comment: (NOTE) SARS-CoV-2 target nucleic acids are NOT DETECTED.  The SARS-CoV-2 RNA is generally detectable in upper respiratory specimens during the acute phase of infection. The lowest concentration of SARS-CoV-2 viral copies this assay can detect is 138 copies/mL. A negative result does not preclude  SARS-Cov-2 infection and should not be used as the sole basis for treatment or other patient management decisions. A negative result may occur with  improper specimen collection/handling, submission of specimen other than nasopharyngeal swab, presence of viral mutation(s) within the areas targeted by this assay, and inadequate number of viral copies(<138 copies/mL). A negative result must be combined with clinical observations, patient history, and epidemiological information. The expected result is Negative.  Fact Sheet for Patients:  BloggerCourse.com  Fact Sheet for Healthcare Providers:  SeriousBroker.it  This test is no t yet approved or cleared by the [place] FDA and  has been authorized for detection and/or diagnosis of SARS-CoV-2 by FDA under an Emergency Use Authorization (EUA). This EUA will remain  in effect (meaning this test can be used) for the duration of the COVID-19 declaration under Section 564(b)(1) of the Act, 21 U.S.C.section 360bbb-3(b)(1), unless the authorization is terminated  or revoked sooner.       Influenza A by PCR NEGATIVE NEGATIVE Final   Influenza B by PCR NEGATIVE NEGATIVE Final    Comment: (NOTE) The Xpert Xpress SARS-CoV-2/FLU/RSV plus assay is intended as an aid in the diagnosis of influenza from Nasopharyngeal swab specimens and should not be used as a sole basis for treatment. Nasal washings and aspirates are unacceptable for Xpert Xpress SARS-CoV-2/FLU/RSV testing.  Fact Sheet for Patients: BloggerCourse.com  Fact Sheet for Healthcare Providers: SeriousBroker.it  This test is not yet approved or cleared by the [place] FDA and has been authorized for detection and/or diagnosis of SARS-CoV-2 by FDA under an Emergency Use Authorization (EUA). This EUA will remain in effect (meaning this test can be used) for the duration of  the COVID-19 declaration under Section 564(b)(1) of the Act, 21 U.S.C. section 360bbb-3(b)(1), unless the authorization is terminated or revoked.  Performed at [facility], [address] [place], [place] [address]   MRSA Next Gen by PCR, Nasal     Status: None   Collection Time: 08/15/20  8:40 PM   Specimen: Nasal Mucosa; Nasal Swab  Result Value Ref Range Status   MRSA by PCR Next Gen NOT DETECTED NOT DETECTED Final    Comment: (NOTE) The GeneXpert MRSA Assay (FDA approved for NASAL specimens only), is one component of a comprehensive MRSA colonization surveillance program. It is not intended to diagnose MRSA infection nor to guide or monitor treatment for MRSA infections. Test performance is not FDA approved in patients less than 29 years old. Performed at [facility], [address] [place], [place] [address]      Labs: Basic Metabolic Panel: Recent Labs  Lab 08/16/20 0133 08/16/20 0532 08/16/20 0908 08/16/20 1220 08/18/20 0513  NA 131* 134* 133* 134* 137  K 3.5 3.7 3.3* 3.4* 4.1  CL 101 102 101 101 101  CO2 20* 22 22 27 29   GLUCOSE 137* 149* 246* 180* 249*  BUN 9 8 7 7 8   CREATININE 0.83 0.81 0.81 0.76 0.57*  CALCIUM 8.1* 8.4* 8.3* 8.3* 9.2  MG  --   --   --  1.8  --   PHOS  --   --   --  1.2*  --    Liver Function Tests: Recent Labs  Lab 08/15/20 1602 08/16/20 1220  AST 30 21  ALT 50* 30  ALKPHOS 135* 99  BILITOT 2.2* 0.8  PROT 8.0 5.7*  ALBUMIN 4.5 3.3*   Recent Labs  Lab 08/15/20 1602  LIPASE 92*   CBC: Recent Labs  Lab 08/15/20 1602 08/16/20 1220  WBC 14.4* 6.4  NEUTROABS 12.1*  --   HGB 14.2 12.3*  HCT 41.3 34.3*  MCV 106.4* 102.1*  PLT 238 174    CBG: Recent Labs  Lab 08/17/20 1529 08/17/20 1611 08/17/20 2043 08/18/20 0751 08/18/20 1201  GLUCAP 129* 111* 254* 202* 166*       Signed:  Vassie Loll MD.  [facility] 08/18/2020, 2:21 PM

## 2020-08-18 NOTE — TOC Transition Note (Signed)
Transition of Care [facility]) - CM/SW Discharge Note   Patient Details  Name: Sylvan Sookdeo MRN: [id number] Date of Birth: Apr 11, 1980  Transition of [facility]) CM/SW Contact:  Leitha Bleak, RN Phone Number: 08/18/2020, 2:24 PM   Clinical Narrative:   Sent DC summary in hub to [facility]. RN called report.  Final next level of care: Psychiatric Hospital Barriers to Discharge: Barriers Resolved   Patient Goals and CMS Choice Patient states their goals for this hospitalization and ongoing recovery are:: Return home CMS Medicare.gov Compare Post Acute Care list provided to:: Patient Choice offered to / list presented to : Patient  Discharge Placement                       Discharge Plan and Services                                     Social Determinants of Health (SDOH) Interventions     Readmission Risk Interventions Readmission Risk Prevention Plan 08/18/2020  Transportation Screening Complete  PCP or Specialist Appt within 5-7 Days Complete  Home Care Screening Complete  Medication Review (RN CM) Complete  Some recent data might be hidden

## 2020-08-18 NOTE — Progress Notes (Signed)
Inpatient Diabetes Program Recommendations  AACE/ADA: New Consensus Statement on Inpatient Glycemic Control   Target Ranges:  Prepandial:   less than 140 mg/dL      Peak postprandial:   less than 180 mg/dL (1-2 hours)      Critically ill patients:  140 - 180 mg/dL  Results for QUE, MENEELY (MRN [id number]) as of 08/18/2020 07:02  Ref. Range 08/17/2020 08:29 08/17/2020 10:01 08/17/2020 11:06 08/17/2020 15:29 08/17/2020 16:11 08/17/2020 20:43  Glucose-Capillary Latest Ref Range: 70 - 99 mg/dL 283 (H) 151 (H) 761 (H) 129 (H) 111 (H) 254 (H)   Review of Glycemic Control  Diabetes history: DM1; makes NO insulin; requires basal, correction, and carb coverage insulin Outpatient Diabetes medications: NPH 15 units BID, Regular 5-10 units TID Current orders for Inpatient glycemic control: Levemir 10 units BID, Novolog 3 units TID with meals, Novolog 0-15 units TID with meals, Novolog 0-5 units QHS   Inpatient Diabetes Program Recommendations:     Insulin:  Please consider increasing Levemir to 11 units BID, meal coverage to Novolog 4 units TID with meals, and decreasing Novolog correction to 0-9 units TID with meals.   Thanks, Orlando Penner, RN, MSN, CDE Diabetes Coordinator Inpatient Diabetes Program [phone] (Team Pager from 8am to 5pm)

## 2020-08-18 NOTE — Progress Notes (Signed)
Pt accepted to [facility]     Patient meets inpatient criteria per Cecilio Asper, NP   Dr. Estill Cotta is the attending provider.    Call report to [phone]  Ree Edman, RN @ [facility] ED notified.     Pt scheduled  to arrive at [facility] today by 1800  Damita Dunnings, MSW, LCSW-A  11:45 AM 08/18/2020

## 2020-08-18 NOTE — Progress Notes (Signed)
Patient has been faxed out due to no beds available at [facility]. Patient meets inpatient criteria per Ene Ajibola,NP. Patient referred to the following facilities:  [facility]  [address] [place] [address] [phone]  [facility]  [address] [place] [address] [phone]  [facility]  [address], [place] [address] [phone]  [facility]  [address] [place] [address] [phone]  [facility]  [address] [place], [place] [address] [phone]  [facility]  [address] [place], [place] [address] [phone]  [facility]  [address] [place] [address] [phone]  [facility]  [address], [place] [address] [phone]  [facility]  [address], [place] [address] [phone]    CSW will continue to monitor disposition.    Damita Dunnings, MSW, LCSW-A  11:20 AM 08/18/2020

## 2021-07-31 ENCOUNTER — Other Ambulatory Visit: Payer: Self-pay

## 2021-07-31 ENCOUNTER — Encounter (HOSPITAL_COMMUNITY): Payer: Self-pay | Admitting: *Deleted

## 2021-07-31 ENCOUNTER — Emergency Department (HOSPITAL_COMMUNITY)
Admission: EM | Admit: 2021-07-31 | Discharge: 2021-07-31 | Disposition: A | Discharge status: Home / Self Care | Payer: Managed Care, Other (non HMO) | Attending: Emergency Medicine | Admitting: Emergency Medicine

## 2021-07-31 DIAGNOSIS — E1165 Type 2 diabetes mellitus with hyperglycemia: Secondary | ICD-10-CM | POA: Insufficient documentation

## 2021-07-31 DIAGNOSIS — Z794 Long term (current) use of insulin: Secondary | ICD-10-CM | POA: Insufficient documentation

## 2021-07-31 DIAGNOSIS — S0181XA Laceration without foreign body of other part of head, initial encounter: Secondary | ICD-10-CM

## 2021-07-31 DIAGNOSIS — Z23 Encounter for immunization: Secondary | ICD-10-CM | POA: Insufficient documentation

## 2021-07-31 DIAGNOSIS — W311XXA Contact with metalworking machines, initial encounter: Secondary | ICD-10-CM | POA: Insufficient documentation

## 2021-07-31 LAB — CBC WITH DIFFERENTIAL/PLATELET
Abs Immature Granulocytes: 0.01 10*3/uL (ref 0.00–0.07)
Basophils Absolute: 0.1 10*3/uL (ref 0.0–0.1)
Basophils Relative: 1 %
Eosinophils Absolute: 0.2 10*3/uL (ref 0.0–0.5)
Eosinophils Relative: 3 %
HCT: 36.9 % — ABNORMAL LOW (ref 39.0–52.0)
Hemoglobin: 13 g/dL (ref 13.0–17.0)
Immature Granulocytes: 0 %
Lymphocytes Relative: 33 %
Lymphs Abs: 1.8 10*3/uL (ref 0.7–4.0)
MCH: 32.4 pg (ref 26.0–34.0)
MCHC: 35.2 g/dL (ref 30.0–36.0)
MCV: 92 fL (ref 80.0–100.0)
Monocytes Absolute: 0.4 10*3/uL (ref 0.1–1.0)
Monocytes Relative: 8 %
Neutro Abs: 2.9 10*3/uL (ref 1.7–7.7)
Neutrophils Relative %: 55 %
Platelets: 205 10*3/uL (ref 150–400)
RBC: 4.01 MIL/uL — ABNORMAL LOW (ref 4.22–5.81)
RDW: 13 % (ref 11.5–15.5)
WBC: 5.4 10*3/uL (ref 4.0–10.5)
nRBC: 0 % (ref 0.0–0.2)

## 2021-07-31 LAB — BASIC METABOLIC PANEL
Anion gap: 11 (ref 5–15)
BUN: 17 mg/dL (ref 6–20)
CO2: 22 mmol/L (ref 22–32)
Calcium: 8.9 mg/dL (ref 8.9–10.3)
Chloride: 98 mmol/L (ref 98–111)
Creatinine, Ser: 0.93 mg/dL (ref 0.61–1.24)
GFR, Estimated: >60 mL/min (ref >60)
Glucose, Bld: 523 mg/dL (ref 70–99)
Potassium: 4.8 mmol/L (ref 3.5–5.1)
Sodium: 131 mmol/L — ABNORMAL LOW (ref 135–145)

## 2021-07-31 LAB — CBG MONITORING, ED: Glucose-Capillary: 547 mg/dL (ref 70–99)

## 2021-07-31 MED ORDER — SODIUM CHLORIDE 0.9 % IV BOLUS
1000.0000 mL | Freq: Once | INTRAVENOUS | Status: AC
  Administered 2021-07-31: 1000 mL via INTRAVENOUS

## 2021-07-31 MED ORDER — INSULIN ASPART 100 UNIT/ML IJ SOLN
15.0000 [IU] | Freq: Once | INTRAMUSCULAR | Status: AC
  Administered 2021-07-31: 15 [IU] via SUBCUTANEOUS
  Filled 2021-07-31: qty 1

## 2021-07-31 MED ORDER — LIDOCAINE HCL (PF) 1 % IJ SOLN
5.0000 mL | Freq: Once | INTRAMUSCULAR | Status: AC
  Administered 2021-07-31: 5 mL
  Filled 2021-07-31: qty 5

## 2021-07-31 MED ORDER — LIDOCAINE-EPINEPHRINE-TETRACAINE (LET) TOPICAL GEL: 3.0000 mL | Freq: Once | TOPICAL | Status: DC

## 2021-07-31 MED ORDER — TETANUS-DIPHTH-ACELL PERTUSSIS 5-2.5-18.5 LF-MCG/0.5 IM SUSY
0.5000 mL | PREFILLED_SYRINGE | Freq: Once | INTRAMUSCULAR | Status: AC
  Administered 2021-07-31: 0.5 mL via INTRAMUSCULAR
  Filled 2021-07-31: qty 0.5

## 2021-07-31 MED ORDER — POVIDONE-IODINE 10 % EX SOLN
CUTANEOUS | Status: AC
  Filled 2021-07-31: qty 14.8

## 2021-07-31 NOTE — ED Notes (Signed)
Pt back due to bleeding to forehead. See triage notes. Compression wrap applied. Nad. No bleeding through dressing at this time

## 2021-07-31 NOTE — Discharge Instructions (Signed)
Please follow-up in the emergency department or your primary care doctor in 5 to 7 days for suture removal.  Please return sooner for any worsening symptoms.  Tylenol and ibuprofen for pain control as needed.

## 2021-07-31 NOTE — ED Provider Notes (Signed)
[facility] EMERGENCY DEPARTMENT Provider Note   CSN: [id number] Arrival date & time: 07/31/21  1739     History   Tommy Martinez is a 41 y.o. male patient who presents to the emerged department with a forehead laceration that occurred just prior to arrival.  Patient was working with a metal grinder when it kicked back and hit him in the head.  Patient's tetanus is not up-to-date.  He denies any other injury.  Bleeding was controlled with direct pressure.   Laceration      Home Medications Prior to Admission medications   Medication Sig Start Date End Date Taking? Authorizing Provider  folic acid (FOLVITE) 1 MG tablet Take 1 tablet (1 mg total) by mouth daily. 08/19/20   Vassie Loll, MD  insulin detemir (LEVEMIR) 100 UNIT/ML injection Inject 0.12 mLs (12 Units total) into the skin 2 (two) times daily. 08/18/20   Vassie Loll, MD  insulin lispro (HUMALOG) 100 UNIT/ML injection Inject 0.05 mLs (5 Units total) into the skin 3 (three) times daily with meals. 08/18/20   Vassie Loll, MD  pantoprazole (PROTONIX) 40 MG tablet Take 1 tablet (40 mg total) by mouth daily. 08/19/20   Vassie Loll, MD  thiamine 100 MG tablet Take 1 tablet (100 mg total) by mouth daily. 08/19/20   Vassie Loll, MD      Allergies    Patient has no known allergies.    Review of Systems   Review of Systems  All other systems reviewed and are negative.   Physical Exam Updated Vital Signs BP (!) 137/94 (BP Location: Right Arm)   Pulse (!) 106   Temp 98.4 F (36.9 C) (Oral)   Resp 18   Ht 5\' 11"  (1.803 m)   Wt 67.2 kg   SpO2 97%   BMI 20.68 kg/m  Physical Exam Vitals and nursing note reviewed.  Constitutional:      Appearance: Normal appearance.  HENT:     Head: Normocephalic.  Eyes:     General:        Right eye: No discharge.        Left eye: No discharge.     Conjunctiva/sclera: Conjunctivae normal.  Pulmonary:     Effort: Pulmonary effort is normal.  Skin:    General: Skin is warm  and dry.     Findings: No rash.     Comments: 8 cm linear horizontal laceration to the forehead.  Area extends to the frontal skull.  Bleeding controlled.  Neurological:     General: No focal deficit present.     Mental Status: He is alert.  Psychiatric:        Mood and Affect: Mood normal.        Behavior: Behavior normal.     ED Results / Procedures / Treatments   Labs (all labs ordered are listed, but only abnormal results are displayed) Labs Reviewed  CBC WITH DIFFERENTIAL/PLATELET - Abnormal; Notable for the following components:      Result Value   RBC 4.01 (*)    HCT 36.9 (*)    All other components within normal limits  BASIC METABOLIC PANEL - Abnormal; Notable for the following components:   Sodium 131 (*)    Glucose, Bld 523 (*)    All other components within normal limits    EKG None  Radiology No results found.  Procedures . Laceration Repair  Date/Time: 07/31/2021 9:04 PM  Performed by: 10/01/2021, PA-C Authorized by: Teressa Lower, PA-C  Consent:    Consent obtained:  Verbal   Consent given by:  Patient   Risks, benefits, and alternatives were discussed: yes     Risks discussed:  Need for additional repair and infection Universal protocol:    Procedure explained and questions answered to patient or proxy's satisfaction: yes     Relevant documents present and verified: yes     Test results available: no     Imaging studies available: no     Required blood products, implants, devices, and special equipment available: no     Site/side marked: no     Immediately prior to procedure, a time out was called: no     Patient identity confirmed:  Verbally with patient and arm band Anesthesia:    Anesthesia method:  Local infiltration   Local anesthetic:  Lidocaine 1% w/o epi Laceration details:    Location:  Face   Face location:  Forehead   Length (cm):  8   Depth (mm):  5 Pre-procedure details:    Preparation:  Patient was prepped and  draped in usual sterile fashion Exploration:    Limited defect created (wound extended): no     Hemostasis achieved with:  Direct pressure   Imaging outcome: foreign body not noted     Wound exploration: wound explored through full range of motion and entire depth of wound visualized     Wound extent: areolar tissue violated     Wound extent: no muscle damage noted, no nerve damage noted, no tendon damage noted, no underlying fracture noted and no vascular damage noted   Treatment:    Area cleansed with:  Povidone-iodine and saline   Amount of cleaning:  Extensive   Irrigation solution:  Sterile saline   Irrigation method:  Pressure wash   Visualized foreign bodies/material removed: no     Debridement:  None   Undermining:  None Skin repair:    Repair method:  Sutures   Suture size:  6-0   Suture material:  Prolene   Suture technique:  Simple interrupted   Number of sutures:  8 Approximation:    Approximation:  Close Repair type:    Repair type:  Simple Post-procedure details:    Dressing:  Open (no dressing)   Procedure completion:  Tolerated well, no immediate complications    Medications Ordered in ED Medications  insulin aspart (novoLOG) injection 15 Units (has no administration in time range)  Tdap (BOOSTRIX) injection 0.5 mL (has no administration in time range)  lidocaine (PF) (XYLOCAINE) 1 % injection 5 mL (5 mLs Infiltration Given 07/31/21 2022)  lidocaine (PF) (XYLOCAINE) 1 % injection 5 mL (5 mLs Infiltration Given 07/31/21 2022)  povidone-iodine (BETADINE) 10 % external solution (  Given 07/31/21 2023)  sodium chloride 0.9 % bolus 1,000 mL (1,000 mLs Intravenous New Bag/Given (Non-Interop) 07/31/21 1930)    ED Course/ Medical Decision Making/ A&P Clinical Course as of 07/31/21 2108  Sat Jul 31, 2021  2106 Basic metabolic panel(!!) Blood sugar noted to be significantly elevated.  This was prefluids. [CF]  2106 CBC with Differential(!) No evidence of leukocytosis or  anemia. [CF]    Clinical Course User Index [CF] Teressa Lower, PA-C                           Medical Decision Making Tommy Martinez is a 41 y.o. male patient who presents to the emergency department today for further evaluation of forehead laceration.  Patient also is a diabetic and his sugar was checked which showed a blood sugar of over 500.  Patient was given a liter bolus and basic labs were obtained.  Patient feeling better after fluids and I also gave him 15 units of insulin.  He has not had his evening insulin this evening which could explain why his blood sugar is elevated.  I repaired the laceration with simple interrupted sutures.  Please see procedure note above for further detail.  Patient tolerated procedure well.  He will need to return to the emerged department in 5 to 7 days for suture removal.  He is safe for discharge at this time.  Tdap was updated today.   Amount and/or Complexity of Data Reviewed Labs: ordered. Decision-making details documented in ED Course.  Risk Prescription drug management.   Final Clinical Impression(s) / ED Diagnoses Final diagnoses:  Laceration of forehead, initial encounter    Rx / DC Orders ED Discharge Orders     None         Jolyn Lent 07/31/21 2108    Gerhard Munch, MD 08/01/21 [phone]

## 2021-07-31 NOTE — ED Triage Notes (Addendum)
Pt with laceration to forehead after a grinder kicked back at 1700 today.  Last tetanus shot last year? Bleeding controlled  at present.

## 2021-07-31 NOTE — ED Notes (Signed)
Date and time results received: 07/31/21 2028 (use smartphrase ".now" to insert current time)  Test: glucose Critical Value: 523  Name of Provider Notified: Enos Fling  Orders Received? Or Actions Taken?: Actions Taken: iv /fluids started

## 2021-07-31 NOTE — ED Notes (Signed)
Pt cbg override, was 547. Edp aware
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
## 2021-10-09 ENCOUNTER — Emergency Department (HOSPITAL_COMMUNITY): Payer: Self-pay

## 2021-10-09 ENCOUNTER — Other Ambulatory Visit: Payer: Self-pay

## 2021-10-09 ENCOUNTER — Encounter (HOSPITAL_COMMUNITY): Payer: Self-pay | Admitting: Emergency Medicine

## 2021-10-09 ENCOUNTER — Telehealth: Payer: Self-pay

## 2021-10-09 ENCOUNTER — Emergency Department (HOSPITAL_COMMUNITY)
Admission: EM | Admit: 2021-10-09 | Discharge: 2021-10-09 | Disposition: A | Discharge status: Home / Self Care | Payer: Self-pay | Attending: Emergency Medicine | Admitting: Emergency Medicine

## 2021-10-09 DIAGNOSIS — R4189 Other symptoms and signs involving cognitive functions and awareness: Secondary | ICD-10-CM

## 2021-10-09 DIAGNOSIS — R569 Unspecified convulsions: Secondary | ICD-10-CM

## 2021-10-09 DIAGNOSIS — R519 Headache, unspecified: Secondary | ICD-10-CM | POA: Insufficient documentation

## 2021-10-09 DIAGNOSIS — R410 Disorientation, unspecified: Secondary | ICD-10-CM | POA: Insufficient documentation

## 2021-10-09 DIAGNOSIS — Z794 Long term (current) use of insulin: Secondary | ICD-10-CM | POA: Insufficient documentation

## 2021-10-09 DIAGNOSIS — E1165 Type 2 diabetes mellitus with hyperglycemia: Secondary | ICD-10-CM | POA: Insufficient documentation

## 2021-10-09 DIAGNOSIS — H538 Other visual disturbances: Secondary | ICD-10-CM | POA: Insufficient documentation

## 2021-10-09 LAB — COMPREHENSIVE METABOLIC PANEL
ALT: 74 U/L — ABNORMAL HIGH (ref 0–44)
AST: 98 U/L — ABNORMAL HIGH (ref 15–41)
Albumin: 4.6 g/dL (ref 3.5–5.0)
Alkaline Phosphatase: 86 U/L (ref 38–126)
Anion gap: 12 (ref 5–15)
BUN: 11 mg/dL (ref 6–20)
CO2: 28 mmol/L (ref 22–32)
Calcium: 9.8 mg/dL (ref 8.9–10.3)
Chloride: 101 mmol/L (ref 98–111)
Creatinine, Ser: 0.84 mg/dL (ref 0.61–1.24)
GFR, Estimated: >60 mL/min (ref >60)
Glucose, Bld: 129 mg/dL — ABNORMAL HIGH (ref 70–99)
Potassium: 4.7 mmol/L (ref 3.5–5.1)
Sodium: 141 mmol/L (ref 135–145)
Total Bilirubin: 0.9 mg/dL (ref 0.3–1.2)
Total Protein: 7.5 g/dL (ref 6.5–8.1)

## 2021-10-09 LAB — CBC
HCT: 44.5 % (ref 39.0–52.0)
Hemoglobin: 15.3 g/dL (ref 13.0–17.0)
MCH: 34 pg (ref 26.0–34.0)
MCHC: 34.4 g/dL (ref 30.0–36.0)
MCV: 98.9 fL (ref 80.0–100.0)
Platelets: 208 10*3/uL (ref 150–400)
RBC: 4.5 MIL/uL (ref 4.22–5.81)
RDW: 14.9 % (ref 11.5–15.5)
WBC: 7 10*3/uL (ref 4.0–10.5)
nRBC: 0 % (ref 0.0–0.2)

## 2021-10-09 LAB — TROPONIN I (HIGH SENSITIVITY)
Troponin I (High Sensitivity): 9 ng/L (ref <18)
Troponin I (High Sensitivity): 9 ng/L (ref <18)

## 2021-10-09 LAB — CBG MONITORING, ED
Glucose-Capillary: 117 mg/dL — ABNORMAL HIGH (ref 70–99)
Glucose-Capillary: 565 mg/dL (ref 70–99)

## 2021-10-09 MED ORDER — GADOPICLENOL 0.5 MMOL/ML IV SOLN
6.7000 mL | Freq: Once | INTRAVENOUS | Status: AC | PRN
  Administered 2021-10-09: 6.7 mL via INTRAVENOUS

## 2021-10-09 MED ORDER — LEVETIRACETAM 500 MG PO TABS
500.0000 mg | ORAL_TABLET | Freq: Once | ORAL | Status: AC
  Administered 2021-10-09: 500 mg via ORAL
  Filled 2021-10-09: qty 1

## 2021-10-09 MED ORDER — INSULIN DETEMIR 100 UNIT/ML ~~LOC~~ SOLN
15.0000 [IU] | Freq: Two times a day (BID) | SUBCUTANEOUS | Status: DC
  Administered 2021-10-09: 15 [IU] via SUBCUTANEOUS
  Filled 2021-10-09 (×2): qty 0.15

## 2021-10-09 MED ORDER — INSULIN ASPART 100 UNIT/ML IJ SOLN
10.0000 [IU] | Freq: Once | INTRAMUSCULAR | Status: AC
  Administered 2021-10-09: 10 [IU] via SUBCUTANEOUS

## 2021-10-09 MED ORDER — INSULIN DETEMIR 100 UNIT/ML ~~LOC~~ SOLN: 12.0000 [IU] | Freq: Two times a day (BID) | SUBCUTANEOUS | Status: DC

## 2021-10-09 MED ORDER — LEVETIRACETAM 500 MG PO TABS: 500.0000 mg | ORAL_TABLET | Freq: Two times a day (BID) | ORAL | 12 refills | Status: AC

## 2021-10-09 MED ORDER — INSULIN ASPART 100 UNIT/ML IJ SOLN: 5.0000 [IU] | Freq: Once | INTRAMUSCULAR | Status: DC

## 2021-10-09 MED ORDER — LEVETIRACETAM 500 MG PO TABS: 500.0000 mg | ORAL_TABLET | Freq: Two times a day (BID) | ORAL | 1 refills | Status: DC

## 2021-10-09 NOTE — Discharge Instructions (Addendum)
Your symptoms have likely been because of seizures.  I have spoken with the neurologist and because we cannot get an EEG done tonight he has asked that we start you on a medicine called Keppra, you will need to take this twice a day at home and follow-up with a neurologist.  I have given you the phone number for both groups in town, which ever 1 can see you for should be fine.  I have also asked them to follow-up with you to get you into the office quicker.  Please return to the ER for severe worsening symptoms

## 2021-10-09 NOTE — ED Provider Notes (Signed)
PT accepted at change of shift Has had seizure like activity at home None seen here MRI negative - Dr. Lorrin Goodell has recommended outpt EEG, start [place] now.  Pt informed and agreeable.  Of note, pt is hyperglycemic but has not had insulin tonight - until I gave it to him -has stable VS.  Dilatory referral to neurology given   Noemi Chapel, MD 10/09/21 [id number]

## 2021-10-09 NOTE — ED Triage Notes (Signed)
Patient BIB sister with complaint of generalized weakness and frequent syncopal episodes starting approximately one month ago. Patient's sister states patient is usually incoherent for 10-30 minutes after waking from syncope. Patient sister states she believes that as time has progressed each episode seems to last longer. Patient is currently alert, oriented, and in no apparent distress at this time.

## 2021-10-09 NOTE — ED Notes (Signed)
This RN spoke with neurology regarding pts EEG being done tonight. RN informed that the EEG techs have gone home for the night and the neurologist will evaluate pt to see how emergent EEG study is. Neurologist to call back once decision has been made. Pt updated regarding plan of care.

## 2021-10-09 NOTE — ED Notes (Signed)
RN reviewed discharge instructions with pt. Pt verbalized understanding and had no further questions. VSS upon discharge.  

## 2021-10-09 NOTE — Telephone Encounter (Signed)
error 

## 2021-10-09 NOTE — Progress Notes (Signed)
Pt is in hall. Can not do EEG in hall.

## 2021-10-09 NOTE — ED Notes (Signed)
Patient transported to MRI 

## 2021-10-09 NOTE — ED Provider Notes (Signed)
[facility] EMERGENCY DEPARTMENT Provider Note   CSN: [id number] Arrival date & time: 10/09/21  0940     History  Chief Complaint  Patient presents with   Loss of Consciousness    Tommy Martinez is a 41 y.o. male.  The history is provided by the patient and medical records. No language interpreter was used.  Loss of Consciousness Episode history:  Multiple Most recent episode:  Today Timing:  Intermittent Progression:  Waxing and waning Chronicity:  New Witnessed: no   Relieved by:  Nothing Worsened by:  Nothing Ineffective treatments:  None tried Associated symptoms: confusion, headaches, malaise/fatigue, nausea and visual change   Associated symptoms: no chest pain, no dizziness, no fever, no focal weakness (intermittent bilateral hand numbness and reakness reported in last month), no palpitations, no recent fall, no recent surgery, no seizures (no hx of), no shortness of breath, no vomiting and no weakness (intermittnet in hands porerted)        Home Medications Prior to Admission medications   Medication Sig Start Date End Date Taking? Authorizing Provider  folic acid (FOLVITE) 1 MG tablet Take 1 tablet (1 mg total) by mouth daily. 08/19/20   Barton Dubois, MD  insulin detemir (LEVEMIR) 100 UNIT/ML injection Inject 0.12 mLs (12 Units total) into the skin 2 (two) times daily. 08/18/20   Barton Dubois, MD  insulin lispro (HUMALOG) 100 UNIT/ML injection Inject 0.05 mLs (5 Units total) into the skin 3 (three) times daily with meals. 08/18/20   Barton Dubois, MD  pantoprazole (PROTONIX) 40 MG tablet Take 1 tablet (40 mg total) by mouth daily. 08/19/20   Barton Dubois, MD  thiamine 100 MG tablet Take 1 tablet (100 mg total) by mouth daily. 08/19/20   Barton Dubois, MD      Allergies    Patient has no known allergies.    Review of Systems   Review of Systems  Constitutional:  Positive for malaise/fatigue. Negative for chills, fatigue and fever.  HENT:   Negative for congestion.   Eyes:  Positive for visual disturbance.  Respiratory:  Negative for cough, chest tightness, shortness of breath and wheezing.   Cardiovascular:  Positive for syncope. Negative for chest pain and palpitations.  Gastrointestinal:  Positive for nausea. Negative for abdominal pain, constipation, diarrhea and vomiting.  Genitourinary:  Negative for dysuria.  Musculoskeletal:  Negative for back pain, neck pain and neck stiffness.  Skin:  Negative for rash and wound.  Neurological:  Positive for light-headedness and headaches. Negative for dizziness, focal weakness (intermittent bilateral hand numbness and reakness reported in last month), seizures (no hx of), weakness (intermittnet in hands porerted) and numbness (intermittent in hands reported).  Psychiatric/Behavioral:  Positive for confusion. Negative for agitation.   All other systems reviewed and are negative.   Physical Exam Updated Vital Signs BP (!) 147/96 (BP Location: Right Arm)   Pulse 81   Temp 98 F (36.7 C)   Resp 17   SpO2 99%  Physical Exam Vitals and nursing note reviewed.  Constitutional:      General: He is not in acute distress.    Appearance: He is well-developed. He is not ill-appearing, toxic-appearing or diaphoretic.  HENT:     Head: Normocephalic and atraumatic.     Nose: No congestion.     Mouth/Throat:     Mouth: Mucous membranes are moist.     Pharynx: No oropharyngeal exudate or posterior oropharyngeal erythema.  Eyes:     Extraocular Movements: Extraocular movements  intact.     Conjunctiva/sclera: Conjunctivae normal.     Pupils: Pupils are equal, round, and reactive to light.  Cardiovascular:     Rate and Rhythm: Normal rate and regular rhythm.     Heart sounds: No murmur heard. Pulmonary:     Effort: Pulmonary effort is normal. No respiratory distress.     Breath sounds: Normal breath sounds. No wheezing or rhonchi.  Chest:     Chest wall: No tenderness.  Abdominal:      General: Abdomen is flat.     Palpations: Abdomen is soft.     Tenderness: There is no abdominal tenderness. There is no right CVA tenderness, left CVA tenderness, guarding or rebound.  Musculoskeletal:        General: No swelling or tenderness.     Cervical back: Neck supple. No tenderness.  Skin:    General: Skin is warm and dry.     Capillary Refill: Capillary refill takes less than 2 seconds.     Findings: No erythema or rash.  Neurological:     General: No focal deficit present.     Mental Status: He is alert.     Sensory: No sensory deficit.     Motor: No weakness.  Psychiatric:        Mood and Affect: Mood normal.     ED Results / Procedures / Treatments   Labs (all labs ordered are listed, but only abnormal results are displayed) Labs Reviewed  COMPREHENSIVE METABOLIC PANEL - Abnormal; Notable for the following components:      Result Value   Glucose, Bld 129 (*)    AST 98 (*)    ALT 74 (*)    All other components within normal limits  CBG MONITORING, ED - Abnormal; Notable for the following components:   Glucose-Capillary 117 (*)    All other components within normal limits  CBC  AMMONIA  MAGNESIUM  TROPONIN I (HIGH SENSITIVITY)  TROPONIN I (HIGH SENSITIVITY)    EKG EKG Interpretation  Date/Time:  Saturday October 09 2021 09:47:47 EDT Ventricular Rate:  79 PR Interval:  146 QRS Duration: 90 QT Interval:  372 QTC Calculation: 426 R Axis:   103 Text Interpretation: Normal sinus rhythm Rightward axis Borderline ECG When compared with ECG of 15-Aug-2020 18:22, PREVIOUS ECG IS PRESENT when compared to prior, similar appearance. NO STEMI Confirmed by Antony Blackbird [phone]) on 10/09/2021 12:22:05 PM  Radiology DG Chest 2 View  Result Date: 10/09/2021 CLINICAL DATA:  Chest pain EXAM: CHEST - 2 VIEW COMPARISON:  08/15/2020 FINDINGS: Chronic hyperinflation without acute airspace process, pneumonia, collapse or consolidation. Negative for edema, effusion, or  pneumothorax. Normal heart size and vascularity. Trachea midline. No acute osseous finding. IMPRESSION: Chronic hyperinflation without acute process by plain radiography Electronically Signed   By: Jerilynn Mages.  Shick M.D.   On: 10/09/2021 10:58    Procedures Procedures    Medications Ordered in ED Medications - No data to display  ED Course/ Medical Decision Making/ A&P                           Medical Decision Making Amount and/or Complexity of Data Reviewed Labs: ordered. Radiology: ordered.    Tommy Martinez is a 41 y.o. male with a past medical history significant for diabetes and previous DKA who presents with concerning unresponsive episodes for syncope versus seizure.  According to patient, he had a left forehead injury with a grinder back in  July and had a laceration repaired without difficulty.  He reports he does not think he ever had any CT imaging but has been having some headaches on and off ever since.  He reports that over the last 2 months he has had approximate 5 episodes of intermittent syncope versus seizure he has been unresponsive but did not have any shaking.  He reports that after an episode that can last between 30 minutes to an hour, he is confused for a long period of time.  He reports that today while in bed he had an episode like this and was very disoriented after the fact.  He is reporting no significant headache or chest pain right now.  Denies any palpitations.  Denies nausea vomiting, constipation, diarrhea, or urinary changes.  He does report that when he wakes in the morning she sometimes has blurry vision that improves.  Denies a history of brain tumors or other cancer.  He reports that he stopped drinking months ago and has not had any recent alcohol or any history of withdrawal or seizures.  No history of seizures otherwise.  Patient does state that intermittently he has a weird sensation with arm numbness and weakness bilaterally that comes and goes.  He denies neck  pain at this time and denies recent neck injury.  No other recent complaints.  On exam, lungs clear and chest nontender.  Abdomen nontender.  Patient has intact sensation and strength in legs.  Intact sensation and strength in upper extremities however his left arm did appear to have slightly positive Hoffmann sign for myelopathy.  No neck tenderness.  No carotid bruit.  Given his report that over the last several months he has had up to 5 episodes of unresponsiveness followed by confusion episodes I am somewhat concerned that patient is having seizures with postictal periods.  With his blurry vision in the mornings, headaches, and these episodes I do feel need to rule out things like masses or space-occupying lesions.  I spoke to neurology who recommend getting a CT head without contrast given the recent trauma, MRI brain with and without contrast as well as MRI C-spine with and without contrast given the possible arm symptoms intermittently.  We will also get labs and she recommended getting an EEG to rule out seizures.  I spoke with the patient's sister who cell phone number is [phone] who is in agreement with this plan.  Anticipate reassessment after work-up is completed to determine disposition.  Care transferred to oncoming team to await for results of MRIs, imaging, and EEG results.        Final Clinical Impression(s) / ED Diagnoses Final diagnoses:  Recurrent episodes of unresponsiveness      Clinical Impression: 1. Recurrent episodes of unresponsiveness     Disposition: Care transferred to oncoming team to await for results of MRIs, imaging, and EEG results.  Anticipate determination disposition based on findings.  This note was prepared with assistance of Systems analyst. Occasional wrong-word or sound-a-like substitutions may have occurred due to the inherent limitations of voice recognition software.     Zamariya Neal, Gwenyth Allegra, MD 10/09/21  [phone]

## 2021-11-10 ENCOUNTER — Encounter: Payer: Self-pay | Admitting: Neurology

## 2021-11-10 ENCOUNTER — Ambulatory Visit: Payer: Managed Care, Other (non HMO) | Admitting: Neurology

## 2021-12-24 DEATH — deceased

## 2022-04-13 IMAGING — DX DG CHEST 1V PORT
2 series · 2 of 2 positions shown · non-contrast
Comparison: 01/21/2020 chest radiograph.

CLINICAL DATA: Fever, nausea and vomiting

EXAM:
PORTABLE CHEST 1 VIEW

[chest ap (1 of 2)]
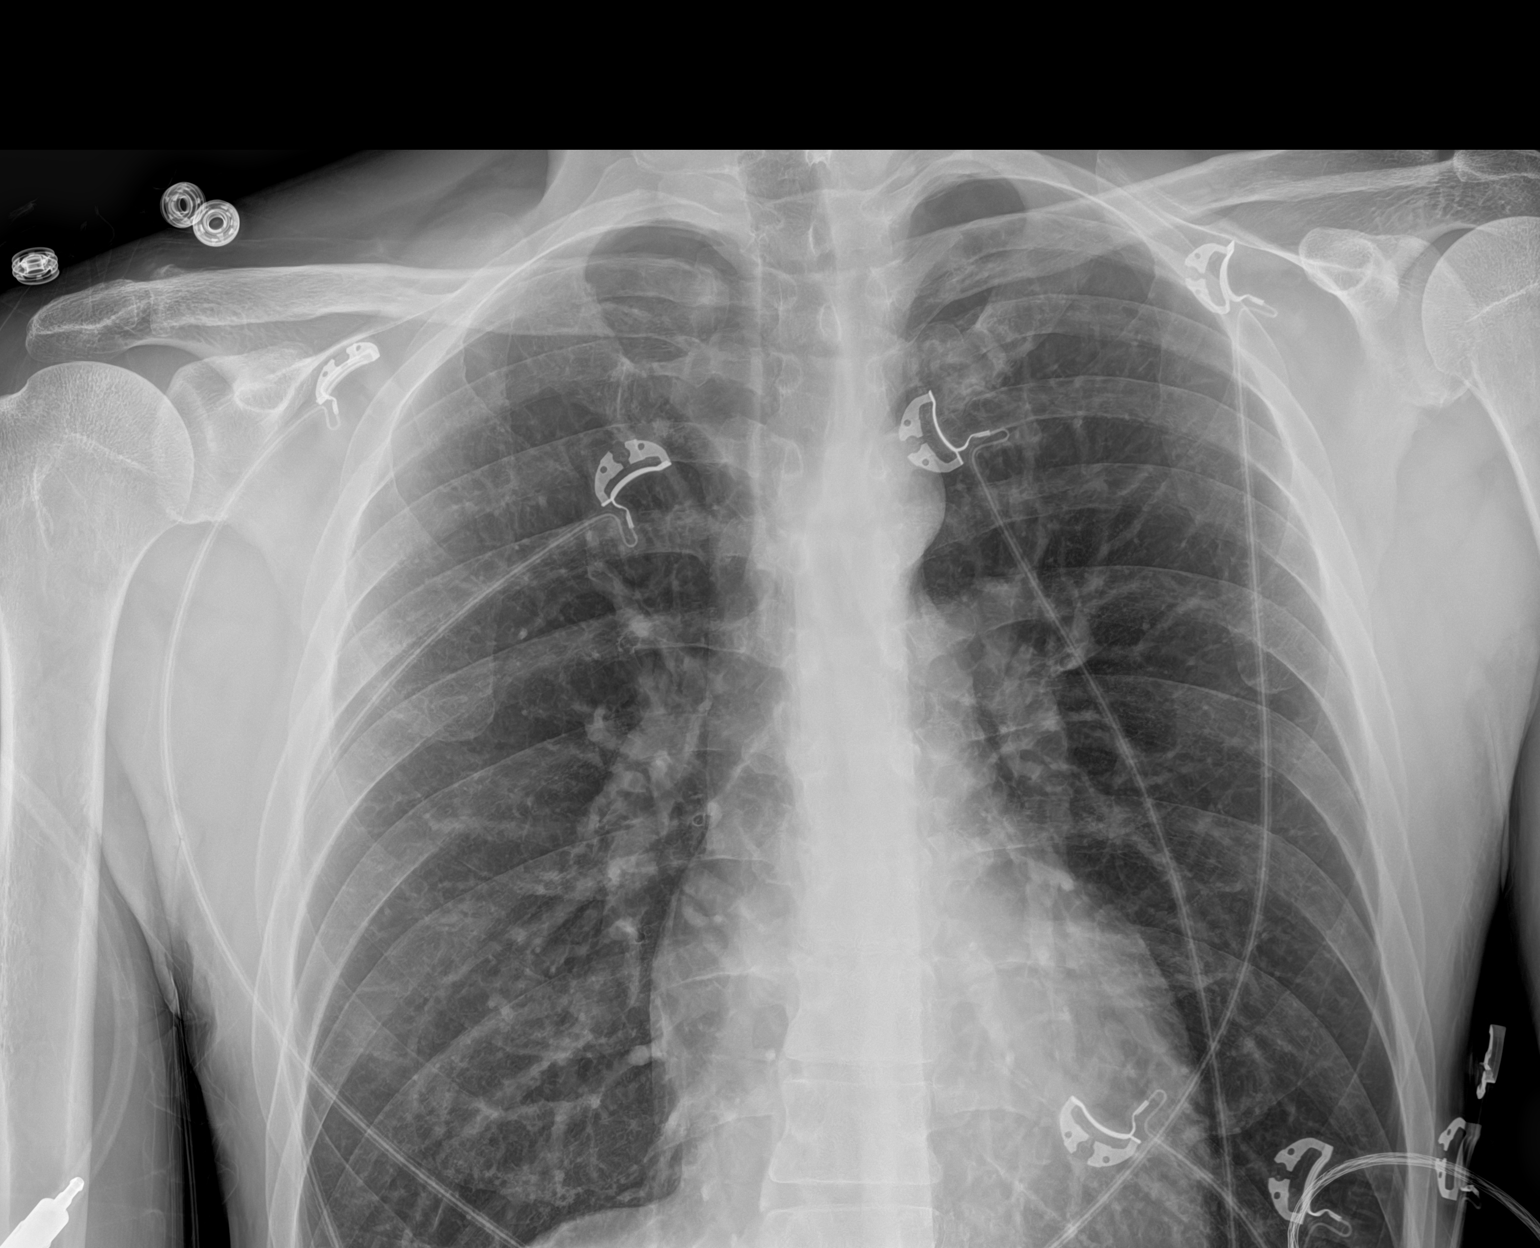

[chest ap (2 of 2)]
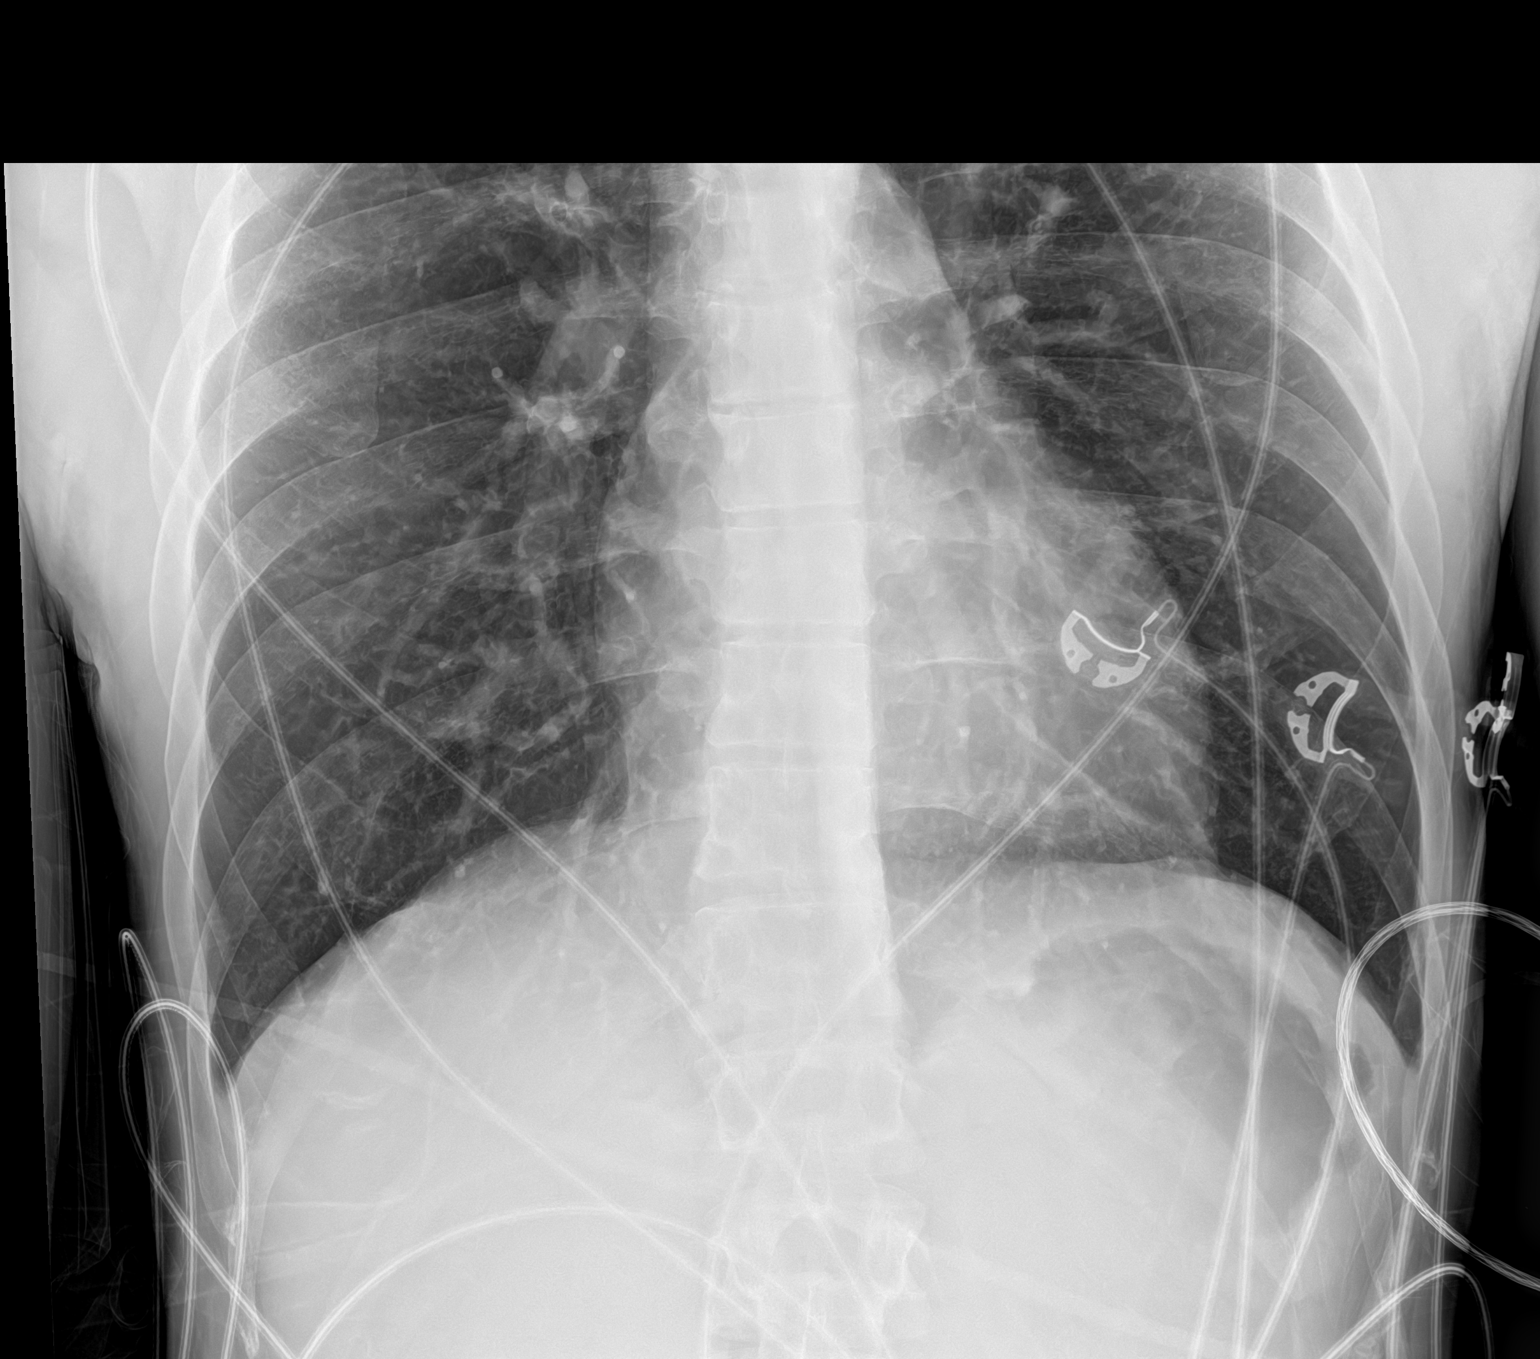

[2 of 2 positions shown; findings below may reference images not displayed]

FINDINGS: Stable cardiomediastinal silhouette with normal heart size. No
pneumothorax. No pleural effusion. Lungs appear clear, with no acute
consolidative airspace disease and no pulmonary edema.
IMPRESSION: No active disease.

## 2023-08-11 ENCOUNTER — Encounter: Payer: Self-pay | Admitting: Advanced Practice Midwife
